# Patient Record
Sex: Male | Born: 1995 | Race: White | Hispanic: No | Marital: Single | Smoking: Never smoker
Health system: Southern US, Community
[De-identification: ages and names within clinical notes are randomized; demographics above are authoritative.]

## PROBLEM LIST (undated history)

## (undated) DIAGNOSIS — H5440 Blindness, one eye, unspecified eye: Secondary | ICD-10-CM

## (undated) DIAGNOSIS — F329 Major depressive disorder, single episode, unspecified: Secondary | ICD-10-CM

## (undated) DIAGNOSIS — F319 Bipolar disorder, unspecified: Secondary | ICD-10-CM

## (undated) DIAGNOSIS — F32A Depression, unspecified: Secondary | ICD-10-CM

## (undated) HISTORY — PX: EYE SURGERY: SHX253

---
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
## 2017-07-28 ENCOUNTER — Emergency Department: Admission: EM | Admit: 2017-07-28 | Discharge: 2017-07-28 | Discharge status: Against Medical Advice | Payer: Self-pay

## 2017-07-28 ENCOUNTER — Emergency Department
Admission: EM | Admit: 2017-07-28 | Discharge: 2017-07-29 | Disposition: A | Discharge status: Other Acute Inpatient Hospital | Payer: Self-pay | Attending: Student in an Organized Health Care Education/Training Program | Admitting: Student in an Organized Health Care Education/Training Program

## 2017-07-28 DIAGNOSIS — F1210 Cannabis abuse, uncomplicated: Secondary | ICD-10-CM

## 2017-07-28 DIAGNOSIS — F29 Unspecified psychosis not due to a substance or known physiological condition: Secondary | ICD-10-CM | POA: Insufficient documentation

## 2017-07-28 DIAGNOSIS — R443 Hallucinations, unspecified: Secondary | ICD-10-CM

## 2017-07-28 DIAGNOSIS — F2081 Schizophreniform disorder: Secondary | ICD-10-CM

## 2017-07-28 LAB — COMPREHENSIVE METABOLIC PANEL
ALK PHOS: 64 U/L (ref 38–126)
ALT: 18 U/L (ref 17–63)
ANION GAP: 11 (ref 5–15)
AST: 23 U/L (ref 15–41)
Albumin: 5.2 g/dL — ABNORMAL HIGH (ref 3.5–5.0)
BILIRUBIN TOTAL: 3.5 mg/dL — AB (ref 0.3–1.2)
BUN: 25 mg/dL — ABNORMAL HIGH (ref 6–20)
CALCIUM: 9.5 mg/dL (ref 8.9–10.3)
CO2: 27 mmol/L (ref 22–32)
CREATININE: 0.99 mg/dL (ref 0.61–1.24)
Chloride: 101 mmol/L (ref 101–111)
Glucose, Bld: 112 mg/dL — ABNORMAL HIGH (ref 65–99)
Potassium: 4 mmol/L (ref 3.5–5.1)
SODIUM: 139 mmol/L (ref 135–145)
TOTAL PROTEIN: 8 g/dL (ref 6.5–8.1)

## 2017-07-28 LAB — CBC WITH DIFFERENTIAL/PLATELET
BASOS PCT: 1 %
Basophils Absolute: 0.1 10*3/uL (ref 0–0.1)
EOS ABS: 0.1 10*3/uL (ref 0–0.7)
EOS PCT: 1 %
HCT: 47.8 % (ref 40.0–52.0)
HEMOGLOBIN: 16.6 g/dL (ref 13.0–18.0)
LYMPHS ABS: 3.1 10*3/uL (ref 1.0–3.6)
Lymphocytes Relative: 32 %
MCH: 30.8 pg (ref 26.0–34.0)
MCHC: 34.8 g/dL (ref 32.0–36.0)
MCV: 88.6 fL (ref 80.0–100.0)
Monocytes Absolute: 0.8 10*3/uL (ref 0.2–1.0)
Monocytes Relative: 8 %
NEUTROS PCT: 58 %
Neutro Abs: 5.6 10*3/uL (ref 1.4–6.5)
PLATELETS: 269 10*3/uL (ref 150–440)
RBC: 5.39 MIL/uL (ref 4.40–5.90)
RDW: 12 % (ref 11.5–14.5)
WBC: 9.6 10*3/uL (ref 3.8–10.6)

## 2017-07-28 LAB — SALICYLATE LEVEL

## 2017-07-28 LAB — ACETAMINOPHEN LEVEL

## 2017-07-28 LAB — ETHANOL: Alcohol, Ethyl (B): <10 mg/dL — ABNORMAL HIGH (ref <5)

## 2017-07-28 MED ORDER — LORAZEPAM 2 MG PO TABS: 2.0000 mg | ORAL_TABLET | ORAL | Status: DC | PRN

## 2017-07-28 MED ORDER — OLANZAPINE 10 MG PO TBDP
10.0000 mg | ORAL_TABLET | Freq: Every day | ORAL | Status: DC
  Administered 2017-07-28: 10 mg via ORAL
  Filled 2017-07-28: qty 2
  Filled 2017-07-28: qty 1

## 2017-07-28 NOTE — Consult Note (Signed)
[place] Psychiatry Consult   Reason for Consult:  Consult for 21 year old man with no past psychiatric history referred from [facility] for psychosis Referring Physician:  Quentin Cornwall Patient Identification: Darryl Hughes MRN:  [id number] Principal Diagnosis: Schizophreniform disorder [facility]) Diagnosis:   Patient Active Problem List   Diagnosis Date Noted  . Schizophreniform disorder ([place]) [F20.81] 07/28/2017  . 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 male patient admitted with "he is with me all the time".  HPI:  Patient interviewed. I also spoke to both his mother and father on the telephone. This is a 34 year old man who was sent here under papers filed at [facility]. The paperwork at [facility] reports that he is acutely psychotic and has been using LSD. On interview the patient presents as a neatly groomed and dressed young man who is speaking in almost complete word salad. He seems to be making at least some attempt to engage in conversation but almost nothing that he says ultimately make sense and he was not able to answer any but a few of the simplest questions. He makes odd statements like saying that "he is with me all the time". When I ask him who he means by this, for instance God, he giggles and makes strange man waving motions with his hands. Ride several times to engage him in at least some basic information and was not able to do so. Nevertheless he was not aggressive or threatening or hostile at all and his affect seems fairly pleasant if a little bit confused. His mother reports that all of this just started about a month ago. Up until that time she says he was functioning okay. She does know that he has been using LSD recently. She is not sure how often he is doing that but thinks that most recent time was about 4 days ago. This weekend apparently he became completely on more. He disappeared for a day or more  at a time and no one knew where he was and then he popped up back at home.  Medical history: Patient has no known significant medical problems  Social history: Patient lives with his mother. Parents are divorced. Has some relationship with his father as well. Patient was attending [employer]. Not clear how recently he was still in school.  Substance abuse history: Apparently he has been using LSD recently. No idea how long that's been going on. Patient wasn't able to reasonably answer any questions about it so I don't know if he's using any other drugs. Drug screen is still pending.  Past Psychiatric History: No past psychiatric history at all. Never seen a mental health professional for anything in the past. Never prescribed any psychiatric medicine. Seeing the doctor at [facility] today was his first encounter with mental health.  Risk to Self: Is patient at risk for suicide?: No, but patient needs Medical Clearance Risk to Others:   Prior Inpatient Therapy:   Prior Outpatient Therapy:    Past Medical History: History reviewed. No pertinent past medical history. History reviewed. No pertinent surgical history. Family History: No family history on file. Family Psychiatric  History: Mother reports that there is one great aunt who has schizophrenia and that several members of the family herself included have had significant depression. Social History:  History  Alcohol use Not on file     History  Drug use: Unknown    Social  History   Social History  . Marital status: Single    Spouse name: N/A  . Number of children: N/A  . Years of education: N/A   Social History Main Topics  . Smoking status: None  . Smokeless tobacco: None  . Alcohol use None  . Drug use: Unknown  . Sexual activity: Not Asked   Other Topics Concern  . None   Social History Narrative  . None   Additional Social History:    Allergies:  No Known Allergies  Labs:  Results for orders placed or performed  during the hospital encounter of 07/28/17 (from the past 48 hour(s))  Comprehensive metabolic panel     Status: Abnormal   Collection Time: 07/28/17  7:24 PM  Result Value Ref Range   Sodium 139 135 - 145 mmol/L   Potassium 4.0 3.5 - 5.1 mmol/L   Chloride 101 101 - 111 mmol/L   CO2 27 22 - 32 mmol/L   Glucose, Bld 112 (H) 65 - 99 mg/dL   BUN 25 (H) 6 - 20 mg/dL   Creatinine, Ser 0.99 0.61 - 1.24 mg/dL   Calcium 9.5 8.9 - 10.3 mg/dL   Total Protein 8.0 6.5 - 8.1 g/dL   Albumin 5.2 (H) 3.5 - 5.0 g/dL   AST 23 15 - 41 U/L   ALT 18 17 - 63 U/L   Alkaline Phosphatase 64 38 - 126 U/L   Total Bilirubin 3.5 (H) 0.3 - 1.2 mg/dL   GFR calc non Af Amer >60 >60 mL/min   GFR calc Af Amer >60 >60 mL/min    Comment: (NOTE) The eGFR has been calculated using the CKD EPI equation. This calculation has not been validated in all clinical situations. eGFR's persistently <60 mL/min signify possible Chronic Kidney Disease.    Anion gap 11 5 - 15  Ethanol     Status: Abnormal   Collection Time: 07/28/17  7:24 PM  Result Value Ref Range   Alcohol, Ethyl (B) <10 (H) <5 mg/dL    Comment:        LOWEST DETECTABLE LIMIT FOR SERUM ALCOHOL IS 10 mg/dL FOR MEDICAL PURPOSES ONLY   CBC with Diff     Status: None   Collection Time: 07/28/17  7:24 PM  Result Value Ref Range   WBC 9.6 3.8 - 10.6 K/uL   RBC 5.39 4.40 - 5.90 MIL/uL   Hemoglobin 16.6 13.0 - 18.0 g/dL   HCT 47.8 40.0 - 52.0 %   MCV 88.6 80.0 - 100.0 fL   MCH 30.8 26.0 - 34.0 pg   MCHC 34.8 32.0 - 36.0 g/dL   RDW 12.0 11.5 - 14.5 %   Platelets 269 150 - 440 K/uL   Neutrophils Relative % 58 %   Neutro Abs 5.6 1.4 - 6.5 K/uL   Lymphocytes Relative 32 %   Lymphs Abs 3.1 1.0 - 3.6 K/uL   Monocytes Relative 8 %   Monocytes Absolute 0.8 0.2 - 1.0 K/uL   Eosinophils Relative 1 %   Eosinophils Absolute 0.1 0 - 0.7 K/uL   Basophils Relative 1 %   Basophils Absolute 0.1 0 - 0.1 K/uL  Acetaminophen level     Status: Abnormal   Collection  Time: 07/28/17  7:24 PM  Result Value Ref Range   Acetaminophen (Tylenol), Serum <10 (L) 10 - 30 ug/mL    Comment:        THERAPEUTIC CONCENTRATIONS VARY SIGNIFICANTLY. A RANGE OF 10-30 ug/mL MAY BE AN EFFECTIVE  CONCENTRATION FOR MANY PATIENTS. HOWEVER, SOME ARE BEST TREATED AT CONCENTRATIONS OUTSIDE THIS RANGE. ACETAMINOPHEN CONCENTRATIONS >150 ug/mL AT 4 HOURS AFTER INGESTION AND >50 ug/mL AT 12 HOURS AFTER INGESTION ARE OFTEN ASSOCIATED WITH TOXIC REACTIONS.   Salicylate level     Status: None   Collection Time: 07/28/17  7:24 PM  Result Value Ref Range   Salicylate Lvl <6.2 2.8 - 30.0 mg/dL    No current facility-administered medications for this encounter.    No current outpatient prescriptions on file.    Musculoskeletal: Strength & Muscle Tone: within normal limits Gait & Station: normal Patient leans: N/A  Psychiatric Specialty Exam: Physical Exam  Nursing note and vitals reviewed. Constitutional: He appears well-developed and well-nourished.  HENT:  Head: Normocephalic and atraumatic.  Eyes: Pupils are equal, round, and reactive to light. Conjunctivae are normal.  Neck: Normal range of motion.  Cardiovascular: Regular rhythm and normal heart sounds.   Respiratory: Effort normal. No respiratory distress.  GI: Soft.  Musculoskeletal: Normal range of motion.  Neurological: He is alert.  Skin: Skin is warm and dry.  Psychiatric: His affect is inappropriate. His speech is delayed and tangential. He is slowed, withdrawn and actively hallucinating. Thought content is delusional. Cognition and memory are impaired. He expresses inappropriate judgment. He expresses no homicidal and no suicidal ideation. He is noncommunicative. He is inattentive.    Review of Systems  Constitutional: Negative.   HENT: Negative.   Eyes: Negative.   Respiratory: Negative.   Cardiovascular: Negative.   Gastrointestinal: Negative.   Musculoskeletal: Negative.   Skin: Negative.    Neurological: Negative.   Psychiatric/Behavioral: Positive for substance abuse.    Pulse 96, temperature 98.9 F (37.2 C), temperature source Oral, resp. rate 18, height 5' 9"  (1.753 m), weight 65.3 kg (144 lb), SpO2 100 %.Body mass index is 21.27 kg/m.  General Appearance: Fairly Groomed  Eye Contact:  Fair  Speech:  Blocked and Slow  Volume:  Decreased  Mood:  Euthymic  Affect:  Inappropriate and Labile  Thought Process:  Disorganized  Orientation:  Negative  Thought Content:  Illogical and Tangential  Suicidal Thoughts:  No  Homicidal Thoughts:  No  Memory:  Negative  Judgement:  Negative  Insight:  Negative  Psychomotor Activity:  Restlessness  Concentration:  Concentration: Poor  Recall:  Poor  Fund of Knowledge:  Poor  Language:  Good  Akathisia:  No  Handed:  Right  AIMS (if indicated):     Assets:  Catering manager Housing Physical Health Resilience Social Support  ADL's:  Intact  Cognition:  Impaired,  Mild  Sleep:        Treatment Plan Summary: Daily contact with patient to assess and evaluate symptoms and progress in treatment, Medication management and Plan 21 year old man who presents as acutely psychotic. Very disorganized in his thinking. No known prior psychiatric history. Differential diagnosis includes schizophreniform disorder, bipolar disorder with psychosis and substance induced as well as rarer reasons to have acute psychosis. Patient clearly needs hospital treatment. Unable to think or make any decisions for himself. Continue the involuntary commitment. Orders will be placed to admit him to the hospital. We will get a full set of labs and EKG and start him on antipsychotic medicine. I have informed his mother that he will probably be admitted to the psychiatric ward here. Mother's name is Eliav Mechling and her telephone number is [phone]  Disposition: Recommend psychiatric Inpatient admission when medically cleared. Supportive  therapy provided about ongoing stressors.  Jenny Reichmann  Hanne Kegg, MD 07/28/2017 9:37 PM

## 2017-07-28 NOTE — ED Triage Notes (Signed)
Patient is brought to the ED from [facility] with IVC papers because he is hallucinating and hearing voices. Patient states he wants to hurt himself but doesn't want to hurt himself. Patient with pressured speech and inability to answer certain questions. Patient without injury at this time.

## 2017-07-28 NOTE — ED Notes (Signed)
PT IVC/ PENDING PLACEMENT  

## 2017-07-28 NOTE — ED Provider Notes (Signed)
[facility] Emergency Department Provider Note    First MD Initiated Contact with Patient 07/28/17 1946     (approximate)  I have reviewed the triage vital signs and the nursing notes.   HISTORY  Chief Complaint Hallucinations    HPI Darryl Hughes is a 21 y.o. male presents from [facility] with IVC papers because of hallucinations actively hearing voices. Patient with very limited helpful history as he does appear to be floridly psychotic with very disorganized thought process. He is talking about hearing voices that tell him to "calm down and see them because we are all connected to the outer space." there is report of LSD use. He denies any SI or HI.   History reviewed. No pertinent past medical history. No family history on file. History reviewed. No pertinent surgical history. Patient Active Problem List   Diagnosis Date Noted  . Schizophreniform disorder ([facility]) 07/28/2017  . Hallucinogen abuse 07/28/2017      Prior to Admission medications   Not on File    Allergies Patient has no known allergies.    Social History Social History  Substance Use Topics  . Smoking status: Not on file  . Smokeless tobacco: Not on file  . Alcohol use Not on file    Review of Systems Patient denies headaches, rhinorrhea, blurry vision, numbness, shortness of breath, chest pain, edema, cough, abdominal pain, nausea, vomiting, diarrhea, dysuria, fevers, rashes or hallucinations unless otherwise stated above in HPI. ____________________________________________   PHYSICAL EXAM:  VITAL SIGNS: Vitals:   07/28/17 1932  Pulse: 96  Resp: 18  Temp: 98.9 F (37.2 C)  SpO2: 100%    Constitutional: Alert disheveled appearing and in no acute distress. Eyes: Conjunctivae are normal.  Head: Atraumatic. Nose: No congestion/rhinnorhea. Mouth/Throat: Mucous membranes are moist.   Neck: No stridor. Painless ROM.  Cardiovascular: Normal rate, regular rhythm.  Grossly normal heart sounds.  Good peripheral circulation. Respiratory: Normal respiratory effort.  No retractions. Lungs CTAB. Gastrointestinal: Soft and nontender. No distention. No abdominal bruits. No CVA tenderness. Musculoskeletal: No lower extremity tenderness nor edema.  No joint effusions. Neurologic:  Normal speech and language. No gross focal neurologic deficits are appreciated. No facial droop Skin:  Skin is warm, dry and intact. No rash noted. Psychiatric: pleasant but very disorganized thought process and difficult to follow ____________________________________________   LABS (all labs ordered are listed, but only abnormal results are displayed)  Results for orders placed or performed during the hospital encounter of 07/28/17 (from the past 24 hour(s))  Comprehensive metabolic panel     Status: Abnormal   Collection Time: 07/28/17  7:24 PM  Result Value Ref Range   Sodium 139 135 - 145 mmol/L   Potassium 4.0 3.5 - 5.1 mmol/L   Chloride 101 101 - 111 mmol/L   CO2 27 22 - 32 mmol/L   Glucose, Bld 112 (H) 65 - 99 mg/dL   BUN 25 (H) 6 - 20 mg/dL   Creatinine, Ser 1.61 0.61 - 1.24 mg/dL   Calcium 9.5 8.9 - 09.6 mg/dL   Total Protein 8.0 6.5 - 8.1 g/dL   Albumin 5.2 (H) 3.5 - 5.0 g/dL   AST 23 15 - 41 U/L   ALT 18 17 - 63 U/L   Alkaline Phosphatase 64 38 - 126 U/L   Total Bilirubin 3.5 (H) 0.3 - 1.2 mg/dL   GFR calc non Af Amer >60 >60 mL/min   GFR calc Af Amer >60 >60 mL/min   Anion  gap 11 5 - 15  Ethanol     Status: Abnormal   Collection Time: 07/28/17  7:24 PM  Result Value Ref Range   Alcohol, Ethyl (B) <10 (H) <5 mg/dL  CBC with Diff     Status: None   Collection Time: 07/28/17  7:24 PM  Result Value Ref Range   WBC 9.6 3.8 - 10.6 K/uL   RBC 5.39 4.40 - 5.90 MIL/uL   Hemoglobin 16.6 13.0 - 18.0 g/dL   HCT 16.1 09.6 - 04.5 %   MCV 88.6 80.0 - 100.0 fL   MCH 30.8 26.0 - 34.0 pg   MCHC 34.8 32.0 - 36.0 g/dL   RDW 40.9 81.1 - 91.4 %   Platelets 269 150 - 440  K/uL   Neutrophils Relative % 58 %   Neutro Abs 5.6 1.4 - 6.5 K/uL   Lymphocytes Relative 32 %   Lymphs Abs 3.1 1.0 - 3.6 K/uL   Monocytes Relative 8 %   Monocytes Absolute 0.8 0.2 - 1.0 K/uL   Eosinophils Relative 1 %   Eosinophils Absolute 0.1 0 - 0.7 K/uL   Basophils Relative 1 %   Basophils Absolute 0.1 0 - 0.1 K/uL  Acetaminophen level     Status: Abnormal   Collection Time: 07/28/17  7:24 PM  Result Value Ref Range   Acetaminophen (Tylenol), Serum <10 (L) 10 - 30 ug/mL  Salicylate level     Status: None   Collection Time: 07/28/17  7:24 PM  Result Value Ref Range   Salicylate Lvl <7.0 2.8 - 30.0 mg/dL   ____________________________________________ ____________________________________________   PROCEDURES  Procedure(s) performed:  Procedures    Critical Care performed: no ____________________________________________   INITIAL IMPRESSION / ASSESSMENT AND PLAN / ED COURSE  Pertinent labs & imaging results that were available during my care of the patient were reviewed by me and considered in my medical decision making (see chart for details).  DDX: Psychosis, delirium, medication effect, noncompliance, polysubstance abuse, Si, Hi, depression   Darryl Hughes is a 21 y.o. who presents to the ED with for evaluation of hallucinations.  Patient has psych history of substance abuse.  Laboratory testing was ordered to evaluation for underlying electrolyte derangement or signs of underlying organic pathology to explain today's presentation.  Based on history and physical and laboratory evaluation, it appears that the patient's presentation is 2/2 underlying psychiatric disorder and will require further evaluation and management by inpatient psychiatry.  Patient was  made an IVC due to disorganized thought process.  Disposition pending psychiatric evaluation.       ____________________________________________   FINAL CLINICAL IMPRESSION(S) / ED DIAGNOSES  Final  diagnoses:  Hallucination  Psychosis, unspecified psychosis type      NEW MEDICATIONS STARTED DURING THIS VISIT:  New Prescriptions   No medications on file     Note:  This document was prepared using Dragon voice recognition software and may include unintentional dictation errors.    Willy Eddy, MD 07/28/17 2146

## 2017-07-28 NOTE — ED Notes (Signed)
Pt called to triage without answer.  

## 2017-07-28 NOTE — BH Assessment (Signed)
Assessment Note  Darryl Hughes is an 21 y.o. male. Pt presents with thought blocking behaviors. This writer was unable to appropriately assess this patient due to his level of acuity. Per triage note, patient is brought to the ED from [facility] with IVC papers because he is hallucinating and hearing voices. Patient states he wants to hurt himself but doesn't want to hurt himself. Patient with pressured speech and inability to answer certain questions.  Diagnosis: Unspecified Schizophrenia Spectrum and Other Psychotic Disorder  Past Medical History: History reviewed. No pertinent past medical history.  History reviewed. No pertinent surgical history.  Family History: No family history on file.  Social History:  has no tobacco, alcohol, and drug history on file.  Additional Social History:  Alcohol / Drug Use Pain Medications: None Reported Prescriptions: None Reported Over the Counter: None Reported History of alcohol / drug use?: No history of alcohol / drug abuse  CIWA: CIWA-Ar Pulse Rate: 96 COWS:    Allergies: No Known Allergies  Home Medications:  (Not in a hospital admission)  OB/GYN Status:  No LMP for male patient.  General Assessment Data Location of Assessment: [facility] ED TTS Assessment: In system Is this a Tele or Face-to-Face Assessment?: Face-to-Face Is this an Initial Assessment or a Re-assessment for this encounter?: Initial Assessment Marital status: Single Maiden name:  (N/A) Is patient pregnant?: No Pregnancy Status: No Living Arrangements:  Otho Bellows) Can pt return to current living arrangement?: Yes Admission Status: Involuntary Is patient capable of signing voluntary admission?: No Referral Source: Self/Family/Friend Insurance type: None  Medical Screening Exam [facility] Walk-in ONLY) Medical Exam completed: Yes  Crisis Care Plan Living Arrangements:  Otho Bellows) Legal Guardian:  (UTA) Name of Psychiatrist: None Name of Therapist: None  Education Status Is  patient currently in school?: No Current Grade: UTA Highest grade of school patient has completed: UTA Name of school: UTA Contact person: UTA  Risk to self with the past 6 months Suicidal Ideation:  (UTA) Has patient been a risk to self within the past 6 months prior to admission? :  (UTA) Suicidal Intent:  (UTA) Has patient had any suicidal intent within the past 6 months prior to admission? :  (UTA) Is patient at risk for suicide?:  (UTA) Suicidal Plan?:  (UTA) Has patient had any suicidal plan within the past 6 months prior to admission? :  (UTA) Access to Means:  (UTA) What has been your use of drugs/alcohol within the last 12 months?:  (UTA) Previous Attempts/Gestures:  (UTA) How many times?:  (UTA) Other Self Harm Risks:  (UTA) Triggers for Past Attempts:  (UTA) Intentional Self Injurious Behavior:  (UTA) Family Suicide History: Unable to assess Recent stressful life event(s):  (UTA) Persecutory voices/beliefs?:  Rich Reining) Depression:  (UTA) Depression Symptoms:  (UTA) Substance abuse history and/or treatment for substance abuse?:  (UTA) Suicide prevention information given to non-admitted patients:  (UTA)  Risk to Others within the past 6 months Homicidal Ideation:  (UTA) Does patient have any lifetime risk of violence toward others beyond the six months prior to admission? :  (UTA) Thoughts of Harm to Others:  (UTA) Current Homicidal Intent:  (UTA) Current Homicidal Plan:  (UTA) Access to Homicidal Means:  (UTA) Identified Victim:  (UTA) History of harm to others?:  (UTA) Assessment of Violence:  (UTA) Violent Behavior Description: UTA Does patient have access to weapons?:  (UTA) Criminal Charges Pending?:  (UTA) Does patient have a court date:  (UTA) Is patient on probation?: Unknown  Psychosis Hallucinations:  (  UTA) Delusions:  (UTA)  Mental Status Report Appearance/Hygiene: Poor hygiene, Body odor, In scrubs Eye Contact: Poor Motor Activity: Freedom of  movement Speech: Incoherent, Word salad Level of Consciousness: Unable to assess Mood:  (UTA) Affect: Blunted (Thought blocking behaviors) Anxiety Level: Minimal Thought Processes: Thought Blocking Judgement: Unable to Assess Orientation: Unable to assess Obsessive Compulsive Thoughts/Behaviors: Unable to Assess  Cognitive Functioning Concentration: Poor Memory: Unable to Assess IQ: Average Insight: Unable to Assess Impulse Control: Unable to Assess Appetite: Good Weight Loss: 0 Weight Gain: 0 Sleep: Unable to Assess Total Hours of Sleep: 0 Vegetative Symptoms: Unable to Assess  ADLScreening [facility] Assessment Services) Patient's cognitive ability adequate to safely complete daily activities?: Yes Patient able to express need for assistance with ADLs?: Yes Independently performs ADLs?: Yes (appropriate for developmental age)  Prior Inpatient Therapy Prior Inpatient Therapy: No  Prior Outpatient Therapy Prior Outpatient Therapy: No Does patient have an ACCT team?: No Does patient have Intensive In-House Services?  : No Does patient have [facility] services? : No Does patient have P4CC services?: No  ADL Screening (condition at time of admission) Patient's cognitive ability adequate to safely complete daily activities?: Yes Patient able to express need for assistance with ADLs?: Yes Independently performs ADLs?: Yes (appropriate for developmental age)       Abuse/Neglect Assessment (Assessment to be complete while patient is alone) Physical Abuse: Denies Verbal Abuse: Denies Sexual Abuse: Denies Exploitation of patient/patient's resources: Denies Self-Neglect: Denies Values / Beliefs Cultural Requests During Hospitalization: None Spiritual Requests During Hospitalization: None Consults Spiritual Care Consult Needed: No Social Work Consult Needed: No      Additional Information 1:1 In Past 12 Months?: No CIRT Risk: No Elopement Risk: No Does patient have  medical clearance?: Yes  Child/Adolescent Assessment Running Away Risk: Denies (Patient is an adult)  Disposition:  Disposition Initial Assessment Completed for this Encounter: Yes Disposition of Patient: Referred to (Admit to inpatient) Patient referred to: Other (Comment) (Admit to inpatient)  On Site Evaluation by:   Reviewed with Physician:    Wilmon Arms 07/28/2017 10:34 PM

## 2017-07-29 ENCOUNTER — Inpatient Hospital Stay
Admission: AD | Admit: 2017-07-29 | Discharge: 2017-08-06 | DRG: 885 | Disposition: A | Discharge status: Home / Self Care | Payer: No Typology Code available for payment source | Attending: Psychiatry | Admitting: Psychiatry

## 2017-07-29 DIAGNOSIS — G4700 Insomnia, unspecified: Secondary | ICD-10-CM | POA: Diagnosis present

## 2017-07-29 DIAGNOSIS — F3164 Bipolar disorder, current episode mixed, severe, with psychotic features: Secondary | ICD-10-CM | POA: Diagnosis present

## 2017-07-29 DIAGNOSIS — F1210 Cannabis abuse, uncomplicated: Secondary | ICD-10-CM | POA: Diagnosis present

## 2017-07-29 DIAGNOSIS — F1690 Hallucinogen use, unspecified, uncomplicated: Secondary | ICD-10-CM | POA: Diagnosis present

## 2017-07-29 MED ORDER — ALUM & MAG HYDROXIDE-SIMETH 200-200-20 MG/5ML PO SUSP
30.0000 mL | ORAL | Status: DC | PRN
  Filled 2017-07-29: qty 30

## 2017-07-29 MED ORDER — OLANZAPINE 5 MG PO TBDP
10.0000 mg | ORAL_TABLET | Freq: Every day | ORAL | Status: DC
  Administered 2017-07-29: 10 mg via ORAL
  Filled 2017-07-29: qty 2

## 2017-07-29 MED ORDER — LORAZEPAM 2 MG PO TABS
2.0000 mg | ORAL_TABLET | ORAL | Status: DC | PRN
  Administered 2017-07-31 – 2017-08-04 (×4): 2 mg via ORAL
  Filled 2017-07-29 (×4): qty 1

## 2017-07-29 MED ORDER — MAGNESIUM HYDROXIDE 400 MG/5ML PO SUSP: 30.0000 mL | Freq: Every day | ORAL | Status: DC | PRN

## 2017-07-29 MED ORDER — ACETAMINOPHEN 325 MG PO TABS: 650.0000 mg | ORAL_TABLET | Freq: Four times a day (QID) | ORAL | Status: DC | PRN

## 2017-07-29 NOTE — Progress Notes (Signed)
Admission Note: Amy RN BMU 21 year old white ma  In under the services of  Dr Jennet Maduro .  Present with  scrubs  Totally confused  Cannot keep a train of thought . Pacing halls on unit. Patient  able to recognize that it is his birthday . Patient hd been using  LSD. Inappropriate Laugther  Pt appeared depressed  With  a flat affect.  Pt  Denies suicidal ideation Pt is redirectable and cooperative with assessment.      A: Pt admitted to unit per protocol, skin assessment and search done with Gigi RN  and no contraband found.  Pt  educated on therapeutic milieu rules. Pt was introduced to milieu by nursing staff.    R: Pt was receptive to education about the milieu .  15 min safety checks started. Clinical research associate offered support

## 2017-07-29 NOTE — ED Notes (Signed)
Patient calm and cooperative.  Mood is elated.  Patient laughs inappropriately when questioned about AVH; however he denies any hallucinations.

## 2017-07-29 NOTE — Tx Team (Signed)
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  The patient and family have been given the opportunity to ask questions and make suggestions.  Crist Infante, RN 07/29/2017, 6:25 PM

## 2017-07-29 NOTE — ED Notes (Signed)
Pt continues to be disoriented. Security and RN have re-directed patient twice after attempting to enter other patient rooms.  Pt returned to assigned room without incident. Maintained on 15 minute checks and observation by security camera for safety.

## 2017-07-29 NOTE — ED Notes (Addendum)
Pt's mother came to visit.  Pt is disoriented and confused, but was happy to see his mother.     Pt currently taking a shower.  Maintained on 15 minute checks and observation by security camera for safety.

## 2017-07-29 NOTE — ED Notes (Signed)
Report to [facility], move to [facility] room 5

## 2017-07-29 NOTE — ED Notes (Signed)
Pt's mother called earlier this morning. Pt unable to speak on the phone, only opening his eyes. No verbal response. Pt is now awake eating lunch. Pt asked this Clinical research associate when he would be able to leave the hospital.  RN explained  that decision would be made by the doctors. When asked if he remembered speaking to the psychiatrist pt stated, "Yes, he did a fine job."     Maintained on 15 minute checks and observation by security camera for safety.

## 2017-07-29 NOTE — ED Notes (Signed)
Patient resting quietly in room. No noted distress or abnormal behaviors noted. Will continue 15 minute checks and observation by security camera for safety. 

## 2017-07-29 NOTE — Plan of Care (Signed)
Problem: Education: Goal: Knowledge of [facility] Education information/materials will improve Outcome: Not Applicable Date Met: 46/19/01 New admission  Unable to understand  Problem: Coping: Goal: Ability to cope will improve Outcome: Not Progressing New admission  Unable to understand Goal: Ability to verbalize feelings will improve Outcome: Not Progressing New admission  Unable to understand  Problem: Safety: Goal: Ability to disclose and discuss suicidal ideas will improve Outcome: Not Progressing New admission  Unable to understand Goal: Ability to identify and utilize support systems that promote safety will improve Outcome: Not Progressing New admission  Unable to understand  Problem: Education: Goal: Knowledge of disease or condition will improve Outcome: Not Progressing New admission  Unable to understand Goal: Understanding of discharge needs will improve Outcome: Not Progressing New admission  Unable to understand

## 2017-07-30 ENCOUNTER — Encounter: Payer: Self-pay | Admitting: Psychiatry

## 2017-07-30 LAB — LIPID PANEL
CHOLESTEROL: 135 mg/dL (ref 0–200)
HDL: 32 mg/dL — ABNORMAL LOW (ref >40)
LDL CALC: 84 mg/dL (ref 0–99)
Total CHOL/HDL Ratio: 4.2 RATIO
Triglycerides: 95 mg/dL (ref <150)
VLDL: 19 mg/dL (ref 0–40)

## 2017-07-30 LAB — HEMOGLOBIN A1C
HEMOGLOBIN A1C: 4.9 % (ref 4.8–5.6)
Mean Plasma Glucose: 93.93 mg/dL

## 2017-07-30 LAB — TSH: TSH: 3.229 u[IU]/mL (ref 0.350–4.500)

## 2017-07-30 MED ORDER — LITHIUM CARBONATE ER 300 MG PO TBCR
300.0000 mg | EXTENDED_RELEASE_TABLET | Freq: Three times a day (TID) | ORAL | Status: DC
  Administered 2017-07-30 – 2017-08-03 (×12): 300 mg via ORAL
  Filled 2017-07-30 (×12): qty 1

## 2017-07-30 MED ORDER — TEMAZEPAM 15 MG PO CAPS
15.0000 mg | ORAL_CAPSULE | Freq: Every evening | ORAL | Status: DC | PRN
  Administered 2017-08-03 – 2017-08-04 (×2): 15 mg via ORAL
  Filled 2017-07-30 (×2): qty 1

## 2017-07-30 MED ORDER — OLANZAPINE 5 MG PO TBDP
10.0000 mg | ORAL_TABLET | Freq: Two times a day (BID) | ORAL | Status: DC
  Administered 2017-07-30 – 2017-08-04 (×10): 10 mg via ORAL
  Filled 2017-07-30 (×9): qty 2

## 2017-07-30 MED ORDER — DIVALPROEX SODIUM 500 MG PO DR TAB: 500.0000 mg | DELAYED_RELEASE_TABLET | Freq: Three times a day (TID) | ORAL | Status: DC

## 2017-07-30 NOTE — [facility] Counselor (Signed)
Adult Comprehensive Assessment  Patient ID: Darryl Hughes, male   DOB: Nov 08, 1995, 21 y.o.   MRN: [id number]  Information Source: Information source: Patient  Current Stressors:  Educational / Learning stressors:  (Pt does not report stressors other than "I'm concerned about all of the bad things in the past.")  Living/Environment/Situation:  Living Arrangements: Parent (mother) Living conditions (as described by patient or guardian): positive situation How long has patient lived in current situation?: pt unable to specify What is atmosphere in current home: Supportive  Family History:  Marital status: Single Are you sexually active?: Yes What is your sexual orientation?: heterosexual Has your sexual activity been affected by drugs, alcohol, medication, or emotional stress?: no Does patient have children?: No  Childhood History:  By whom was/is the patient raised?: Both parents Additional childhood history information: Parents divorced when pt was young. (unable to give specific age) "I only remember the good things about my childhood." Description of patient's relationship with caregiver when they were a child: Good relationships with both parents. Patient's description of current relationship with people who raised him/her: Good relationship with both parents.  Father not in the home, pt reports he sees his father frequently. How were you disciplined when you got in trouble as a child/adolescent?: appropriate discipline Does patient have siblings?: Yes Number of Siblings: 5 Description of patient's current relationship with siblings: 2 brothers, 3 sisters.  Pt reports he gets along with his siblings "when I see them."  All live in state of [place]. Did patient suffer any verbal/emotional/physical/sexual abuse as a child?: No Did patient suffer from severe childhood neglect?: No Has patient ever been sexually abused/assaulted/raped as an adolescent or adult?: No Was the patient ever a  victim of a crime or a disaster?: No Witnessed domestic violence?: No Has patient been effected by domestic violence as an adult?: No  Education:  Highest grade of school patient has completed: HS graduate, less than one year of college at [employer]. Currently a student?: No Learning disability?:  (unknown)  Employment/Work Situation:   Employment situation: Unemployed Patient's job has been impacted by current illness: Yes Describe how patient's job has been impacted: unable to hold job What is the longest time patient has a held a job?: 6 months? Where was the patient employed at that time?: [employer] Has patient ever been in the [employer]?: No Are There Guns or Other Weapons in Your Home?: No  Financial Resources:   Financial resources: No income Does patient have a Lawyer or guardian?: No  Alcohol/Substance Abuse:   What has been your use of drugs/alcohol within the last 12 months?: alcohol: "when I'm with my friends"  "I drink a lot."  Drugs: Marijuana, Acid.  MDMA.  Pt unable to give specifics of use patterns. If attempted suicide, did drugs/alcohol play a role in this?: No Alcohol/Substance Abuse Treatment Hx: Denies past history Has alcohol/substance abuse ever caused legal problems?: No  Social Support System:   Patient's Community Support System: Good Describe Community Support System: mother, father, siblings Type of faith/religion: I used to go to church How does patient's faith help to cope with current illness?: Pt unable to answer  Leisure/Recreation:   Leisure and Hobbies: sports: tennis, basketball.  Music: vocals.  Strengths/Needs:   What things does the patient do well?: Pt unable to answer In what areas does patient struggle / problems for patient: PT unable to answer  Discharge Plan:   Does patient have access to transportation?: Yes (mother) Will patient  be returning to same living situation after discharge?: Yes (with mother) Currently receiving  community mental health services: No If no, would patient like referral for services when discharged?: Yes (What [place] Does patient have financial barriers related to discharge medications?: Yes Patient description of barriers related to discharge medications: no insurance  Summary/Recommendations:   Summary and Recommendations (to be completed by the evaluator): Pt is 21 year old male from [place]. Pt is diagnosed with Schizophreniform disorder and was admitted due to new onset psychosis.  Recommendations for pt include crisis stabilization, therapeutic milieu, attend and participate in groups, medication management, and development of comprhensive mental wellness plan.  Lorri Frederick. 07/30/2017

## 2017-07-30 NOTE — [facility] Group Notes (Signed)
[facility] LCSW Group Therapy Note  Date/Time: 07/30/17, 0930  Type of Therapy/Topic:  Group Therapy:  Balance in Life  Participation Level:  active  Description of Group:    This group will address the concept of balance and how it feels and looks when one is unbalanced. Patients will be encouraged to process areas in their lives that are out of balance, and identify reasons for remaining unbalanced. Facilitators will guide patients utilizing problem- solving interventions to address and correct the stressor making their life unbalanced. Understanding and applying boundaries will be explored and addressed for obtaining  and maintaining a balanced life. Patients will be encouraged to explore ways to assertively make their unbalanced needs known to significant others in their lives, using other group members and facilitator for support and feedback.  Therapeutic Goals: 1. Patient will identify two or more emotions or situations they have that consume much of in their lives. 2. Patient will identify signs/triggers that life has become out of balance:  3. Patient will identify two ways to set boundaries in order to achieve balance in their lives:  4. Patient will demonstrate ability to communicate their needs through discussion and/or role plays  Summary of Patient Progress:Pt identified mental/emotional and family as out of balance in his life.  Pt then said that his own mental health problems are stressing out his family.  Pt made a number of appropriate comments and contributions to the group discussion.          Therapeutic Modalities:   Cognitive Behavioral Therapy Solution-Focused Therapy Assertiveness Training  Daleen Squibb, [place]

## 2017-07-30 NOTE — H&P (Addendum)
Psychiatric Admission Assessment Adult  Patient Identification: Darryl Hughes MRN:  [id number] Date of Evaluation:  07/30/2017 Chief Complaint:  Depression Principal Diagnosis: Bipolar I disorder, most recent episode mixed, severe with psychotic features [facility]) Diagnosis:   Patient Active Problem List   Diagnosis Date Noted  . Bipolar I disorder, most recent episode mixed, severe with psychotic features (HCC) [F31.64] 07/29/2017  . 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 Darryl Hughes is a 21 year old male with first episode psychosis.  Chief complaint. "I was suicidal but not any longer."  History of present illness. Information was obtained from the patient and the chart. The patient was brought to the emergency room floridly psychotic and unable to provide any information. His family. The patient was using. He was admitted to psychiatry unit with a diagnosis of psychotic break. Today the patient is still disorganized but able to participate in the interview. She reports that for the past week or so he has been extremely depressed, lonely, and suicidal. He came to the hospital to "get away from his family". He is very adamant to tell me that he no longer feels depressed or suicidal. He is only depressed about the fact that he is in the hospital. He demands to be discharged as soon as possible. He quickly understood the rules of voluntary and involuntary admission and wants to sign 72 hour form. He reports that prior to depressive episode he felt extremely happy. He denies substance use understands that this is what his mother believes. He seems somewhat paranoid but denies auditory or visual hallucinations or religious preoccupations. He is hyper, argumentative, edgy, and very sharp. He denies any changes in his sleep and believes that he can sleep 8 hours if he chooses to. He denies changes in appetite. He denies any symptoms of anxiety.  Past  psychiatric history. Apparently there is no history of mental illness or substance abuse. The patient makes a comment that he is problems occurred very suddenly causing academic problems. He denies ever attempting suicide. There were no hospitalizations.  Family psychiatric history. The patient denies any knowledge of history of mental illness, substance abuse, or suicide in the family.  Social history. He lives with his parents. He does not work. He went to [employer] but dropped out. He tried [employer] but did not do well.   Total Time spent with patient: 1 hour  Is the patient at risk to self? Yes.    Has the patient been a risk to self in the past 6 months? No.  Has the patient been a risk to self within the distant past? No.  Is the patient a risk to others? No.  Has the patient been a risk to others in the past 6 months? No.  Has the patient been a risk to others within the distant past? No.   Prior Inpatient Therapy:   Prior Outpatient Therapy:    Alcohol Screening: 1. How often do you have a drink containing alcohol?: Monthly or less 2. How many drinks containing alcohol do you have on a typical day when you are drinking?: 3 or 4 3. How often do you have six or more drinks on one occasion?: Less than monthly Preliminary Score: 2 4. How often during the last year have you found that you were not able to stop drinking once you had started?: Less than monthly 5. How often during the last year have you failed to do what was normally expected  from you becasue of drinking?: Never 6. How often during the last year have you needed a first drink in the morning to get yourself going after a heavy drinking session?: Never 7. How often during the last year have you had a feeling of guilt of remorse after drinking?: Never 8. How often during the last year have you been unable to remember what happened the night before because you had been drinking?: Never 9. Have you or someone else been injured as a  result of your drinking?: No 10. Has a relative or friend or a doctor or another health worker been concerned about your drinking or suggested you cut down?: No Alcohol Use Disorder Identification Test Final Score (AUDIT): 4 Substance Abuse History in the last 12 months:  No. Consequences of Substance Abuse: NA Previous Psychotropic Medications: No  Psychological Evaluations: No  Past Medical History: History reviewed. No pertinent past medical history. History reviewed. No pertinent surgical history. Family History: History reviewed. No pertinent family history.  Tobacco Screening: Have you used any form of tobacco in the last 30 days? (Cigarettes, Smokeless Tobacco, Cigars, and/or Pipes): No Social History:  History  Alcohol use Not on file     History  Drug use: Unknown    Additional Social History: Marital status: Single Are you sexually active?: Yes What is your sexual orientation?: heterosexual Has your sexual activity been affected by drugs, alcohol, medication, or emotional stress?: no Does patient have children?: No                         Allergies:  Not on File Lab Results:  Results for orders placed or performed during the hospital encounter of 07/29/17 (from the past 48 hour(s))  Hemoglobin A1c     Status: None   Collection Time: 07/30/17  6:56 AM  Result Value Ref Range   Hgb A1c MFr Bld 4.9 4.8 - 5.6 %    Comment: (NOTE) Pre diabetes:          5.7%-6.4% Diabetes:              >6.4% Glycemic control for   <7.0% adults with diabetes    Mean Plasma Glucose 93.93 mg/dL    Comment: Performed at [facility], [address] [place], [place] [address]  Lipid panel     Status: Abnormal   Collection Time: 07/30/17  6:56 AM  Result Value Ref Range   Cholesterol 135 0 - 200 mg/dL   Triglycerides 95 <604 mg/dL   HDL 32 (L) >54 mg/dL   Total CHOL/HDL Ratio 4.2 RATIO   VLDL 19 0 - 40 mg/dL   LDL Cholesterol 84 0 - 99 mg/dL    Comment:        Total  Cholesterol/HDL:CHD Risk Coronary Heart Disease Risk Table                     Men   Women  1/2 Average Risk   3.4   3.3  Average Risk       5.0   4.4  2 X Average Risk   9.6   7.1  3 X Average Risk  23.4   11.0        Use the calculated Patient Ratio above and the CHD Risk Table to determine the patient's CHD Risk.        ATP III CLASSIFICATION (LDL):  <100     mg/dL   Optimal  100-129  mg/dL   Near or Above                    Optimal  130-159  mg/dL   Borderline  161-096  mg/dL   High  >045     mg/dL   Very High   TSH     Status: None   Collection Time: 07/30/17  6:56 AM  Result Value Ref Range   TSH 3.229 0.350 - 4.500 uIU/mL    Comment: Performed by a 3rd Generation assay with a functional sensitivity of <=0.01 uIU/mL.    Blood Alcohol level:  Lab Results  Component Value Date   ETH <10 (H) 07/28/2017    Metabolic Disorder Labs:  Lab Results  Component Value Date   HGBA1C 4.9 07/30/2017   MPG 93.93 07/30/2017   No results found for: PROLACTIN Lab Results  Component Value Date   CHOL 135 07/30/2017   TRIG 95 07/30/2017   HDL 32 (L) 07/30/2017   CHOLHDL 4.2 07/30/2017   VLDL 19 07/30/2017   LDLCALC 84 07/30/2017    Current Medications: Current Facility-Administered Medications  Medication Dose Route Frequency Provider Last Rate Last Dose  . acetaminophen (TYLENOL) tablet 650 mg  650 mg Oral Q6H PRN Clapacs, John T, MD      . alum & mag hydroxide-simeth (MAALOX/MYLANTA) 200-200-20 MG/5ML suspension 30 mL  30 mL Oral Q4H PRN Clapacs, John T, MD      . lithium carbonate (LITHOBID) CR tablet 300 mg  300 mg Oral TID AC Katria Botts B, MD      . LORazepam (ATIVAN) tablet 2 mg  2 mg Oral Q4H PRN Clapacs, John T, MD      . magnesium hydroxide (MILK OF MAGNESIA) suspension 30 mL  30 mL Oral Daily PRN Clapacs, John T, MD      . OLANZapine zydis (ZYPREXA) disintegrating tablet 10 mg  10 mg Oral BID AC & HS Takyra Cantrall B, MD      . temazepam (RESTORIL)  capsule 15 mg  15 mg Oral QHS PRN Zeno Hickel B, MD       PTA Medications: No prescriptions prior to admission.    Musculoskeletal: Strength & Muscle Tone: within normal limits Gait & Station: normal Patient leans: N/A  Psychiatric Specialty Exam: Physical Exam  Nursing note and vitals reviewed. Constitutional: He is oriented to person, place, and time. He appears well-developed and well-nourished.  HENT:  Head: Normocephalic and atraumatic.  Eyes: Pupils are equal, round, and reactive to light. Conjunctivae and EOM are normal.  Neck: Normal range of motion. Neck supple.  Cardiovascular: Normal rate, regular rhythm and normal heart sounds.   Respiratory: Effort normal and breath sounds normal.  GI: Soft. Bowel sounds are normal.  Musculoskeletal: Normal range of motion.  Neurological: He is alert and oriented to person, place, and time.  Skin: Skin is warm and dry.  Psychiatric: His affect is angry and labile. His speech is rapid and/or pressured. He is agitated and hyperactive. Thought content is paranoid and delusional. Cognition and memory are normal. He expresses impulsivity. He expresses suicidal ideation.    Review of Systems  Constitutional: Negative.   HENT: Negative.   Eyes: Negative.   Respiratory: Negative.   Cardiovascular: Negative.   Gastrointestinal: Negative.   Genitourinary: Negative.   Musculoskeletal: Negative.   Skin: Negative.   Neurological: Negative.   Endo/Heme/Allergies: Negative.   Psychiatric/Behavioral: Positive for depression, substance abuse and suicidal ideas. The patient is  nervous/anxious and has insomnia.     Blood pressure 125/67, pulse (!) 51, temperature 98 F (36.7 C), resp. rate 16, height  (1.727 m), weight 63.5 kg (140 lb), SpO2 100 %.Body mass index is 21.29 kg/m.  See SRA.                                                  Sleep:  Number of Hours: 8.45    Treatment Plan Summary: Daily  contact with patient to assess and evaluate symptoms and progress in treatment and Medication management   Mr. Allerton is a 21 year old male with no past psychiatric history admitted floridly psychotic and suicidal.  1. Suicidal ideation. The patient is able to contract for safety in the hospital.  2. Mood and psychosis. He was started on Zyprexa for psychosis and Lithium for mood stabilization.  3. Insomnia. Restoril is available.  4. Metabolic syndrome monitoring. Lipid panel, TSH, and hemoglobin A1c are pending.  5. EKG. Pending.  6. Substance abuse. We do not have clear understanding of his use at present.  7. Disposition. He will be discharged to home with his family. He will follow up with a new provider.   Observation Level/Precautions:  15 minute checks  Laboratory:  CBC Chemistry Profile UDS UA  Psychotherapy:    Medications:    Consultations:    Discharge Concerns:    Estimated LOS:  Other:     Physician Treatment Plan for Primary Diagnosis: Bipolar I disorder, most recent episode mixed, severe with psychotic features (HCC) Long Term Goal(s): Improvement in symptoms so as ready for discharge  Short Term Goals: Ability to identify changes in lifestyle to reduce recurrence of condition will improve, Ability to verbalize feelings will improve, Ability to disclose and discuss suicidal ideas, Ability to demonstrate self-control will improve, Ability to identify and develop effective coping behaviors will improve, Ability to maintain clinical measurements within normal limits will improve, Compliance with prescribed medications will improve and Ability to identify triggers associated with substance abuse/mental health issues will improve  Physician Treatment Plan for Secondary Diagnosis: Principal Problem:   Bipolar I disorder, most recent episode mixed, severe with psychotic features (HCC)  Long Term Goal(s): Improvement in symptoms so as ready for discharge  Short  Term Goals: Ability to identify changes in lifestyle to reduce recurrence of condition will improve, Ability to demonstrate self-control will improve and Ability to identify triggers associated with substance abuse/mental health issues will improve  I certify that inpatient services furnished can reasonably be expected to improve the patient's condition.    Kristine Linea, MD 9/27/20185:55 PM

## 2017-07-30 NOTE — Plan of Care (Signed)
Problem: Coping: Goal: Ability to cope will improve Outcome: Progressing Pt coping skills will improve this shift.   

## 2017-07-30 NOTE — Progress Notes (Addendum)
Pleasant and cooperative.  Denies SI/HI/AVH.  Verbalizes that he is here because of his mother. Further states that he is not going to change his habits.  Up in dayroom.  Interacting with peers and staff appropriately.  Attending groups.    1637 Patient noted standing at the end of the hall with head down.  Asked if he was going to go eat. States "I was hungry but I am not anymore"  Asked if anything in particular happened to make him not want to eat, states "I just want to go home.  I am use to doing what I want to but someone is always with me" When asked who states "I don't want to talk about it, it is to complicated"  Talks in circles.  Laughing inappropriately.  States "I want to go out of the door but I know someone will tackle me"

## 2017-07-30 NOTE — [facility] Group Notes (Signed)
LCSW Group Therapy Note 07/30/2017 1:15pm  Type of Therapy and Topic:  Group Therapy:  Setting Goals  Participation Level:  Active  Description of Group: In this process group, patients discussed using strengths to work toward goals and address challenges.  Patients identified two positive things about themselves and one goal they were working on.  Patients were given the opportunity to share openly and support each other's plan for self-empowerment.  The group discussed the value of gratitude and were encouraged to have a daily reflection of positive characteristics or circumstances.  Patients were encouraged to identify a plan to utilize their strengths to work on current challenges and goals.  Therapeutic Goals 1. Patient will verbalize personal strengths/positive qualities and relate how these can assist with achieving desired personal goals 2. Patients will verbalize affirmation of peers plans for personal change and goal setting 3. Patients will explore the value of gratitude and positive focus as related to successful achievement of goals 4. Patients will verbalize a plan for regular reinforcement of personal positive qualities and circumstances.  Summary of Patient Progress:  Pt is focused on discharge, despite being admitted just yesterday. Pt is difficult to redirect, sarcastic, and distrusting. Pt identified a daily goal of, "Going home to visit my family."     Therapeutic Modalities Cognitive Behavioral Therapy Motivational Interviewing    Verdene Lennert, LCSW 07/30/2017 12:46 PM

## 2017-07-30 NOTE — [facility] Suicide Risk Assessment (Signed)
[facility] Admission Suicide Risk Assessment   Nursing information obtained from:    Demographic factors:    Current Mental Status:    Loss Factors:    Historical Factors:    Risk Reduction Factors:     Total Time spent with patient: 1 hour Principal Problem: Bipolar I disorder, most recent episode mixed, severe with psychotic features Darryl [facility]) Diagnosis:   Patient Active Problem List   Diagnosis Date Noted  . Bipolar I disorder, most recent episode mixed, severe with psychotic features (HCC) [F31.64] 07/29/2017  . 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  Continued Clinical Symptoms:  Alcohol Use Disorder Identification Test Final Score (AUDIT): 4 The "Alcohol Use Disorders Identification Test", Guidelines for Use in Primary Care, Second Edition.  World Science writer [facility]). Score between 0-7:  no or low risk or alcohol related problems. Score between 8-15:  moderate risk of alcohol related problems. Score between 16-19:  high risk of alcohol related problems. Score 20 or above:  warrants further diagnostic evaluation for alcohol dependence and treatment.   CLINICAL FACTORS:   Bipolar Disorder:   Mixed State Alcohol/Substance Abuse/Dependencies   Musculoskeletal: Strength & Muscle Tone: within normal limits Gait & Station: normal Patient leans: N/A  Psychiatric Specialty Exam: Physical Exam  Nursing note and vitals reviewed. Psychiatric: His affect is angry, labile and inappropriate. His speech is rapid and/or pressured. He is agitated and hyperactive. Thought content is paranoid and delusional. Cognition and memory are normal. He expresses impulsivity. He expresses suicidal ideation. He expresses suicidal plans.    Review of Systems  Constitutional: Negative.   HENT: Negative.   Eyes: Negative.   Respiratory: Negative.   Cardiovascular: Negative.   Gastrointestinal: Negative.   Genitourinary: Negative.   Musculoskeletal: Negative.    Skin: Negative.   Neurological: Negative.   Endo/Heme/Allergies: Negative.   Psychiatric/Behavioral: Positive for depression, substance abuse and suicidal ideas. The patient has insomnia.     Blood pressure 125/67, pulse (!) 51, temperature 98 F (36.7 C), resp. rate 16, height  (1.727 m), weight 63.5 kg (140 lb), SpO2 100 %.Body mass index is 21.29 kg/m.  General Appearance: Casual  Eye Contact:  Good  Speech:  Pressured  Volume:  Increased  Mood:  Angry, Dysphoric and Irritable  Affect:  Congruent  Thought Process:  Disorganized and Descriptions of Associations: Loose  Orientation:  Full (Time, Place, and Person)  Thought Content:  Delusions and Paranoid Ideation  Suicidal Thoughts:  Yes.  with intent/plan  Homicidal Thoughts:  No  Memory:  Immediate;   Fair Recent;   Fair Remote;   Fair  Judgement:  Poor  Insight:  Lacking  Psychomotor Activity:  Increased  Concentration:  Concentration: Fair and Attention Span: Fair  Recall:  [employer] of Knowledge:  Fair  Language:  Fair  Akathisia:  No  Handed:  Right  AIMS (if indicated):     Assets:  Communication Skills Desire for Improvement Financial Resources/Insurance Housing Physical Health Resilience Social Support Transportation Vocational/Educational  ADL's:  Intact  Cognition:  WNL  Sleep:  Number of Hours: 8.45      COGNITIVE FEATURES THAT CONTRIBUTE TO RISK:  None    SUICIDE RISK:   Severe:  Frequent, intense, and enduring suicidal ideation, specific plan, no subjective intent, but some objective markers of intent (i.e., choice of lethal method), the method is accessible, some limited preparatory behavior, evidence of impaired self-control, severe dysphoria/symptomatology, multiple risk factors present, and few if any protective  factors, particularly a lack of social support.  PLAN OF CARE: Hospital admission, medication management, substance abuse counseling, discharge planning.  Mr. Darryl Hughes is a  21 year old male with no past psychiatric history admitted floridly psychotic and suicidal.  1. Suicidal ideation. The patient is able to contract for safety in the hospital.  2. Mood and psychosis. He was started on Zyprexa for psychosis and Depakote for mood stabilization.  3. Insomnia. Restoril is available.  4. Metabolic syndrome monitoring. Lipid panel, TSH, and hemoglobin A1c are pending.  5. EKG. Pending.  6. Substance abuse. We do not have clear understanding of his use at present.  7. Disposition. He will be discharged to home with his family. He will follow up with a new provider.     I certify that inpatient services furnished can reasonably be expected to improve the patient's condition.   Kristine Linea, MD 07/30/2017, 5:43 PM

## 2017-07-30 NOTE — [facility] Suicide Risk Assessment (Signed)
[facility] INPATIENT:  Family/Significant Other Suicide Prevention Education  Suicide Prevention Education:  Education Completed; Lorne Winkels, mother, [phone], has been identified by the patient as the family member/significant other with whom the patient will be residing, and identified as the person(s) who will aid the patient in the event of a mental health crisis (suicidal ideations/suicide attempt).  With written consent from the patient, the family member/significant other has been provided the following suicide prevention education, prior to the and/or following the discharge of the patient.  The suicide prevention education provided includes the following:  Suicide risk factors  Suicide prevention and interventions  National Suicide Hotline telephone number  [facility] assessment telephone number  [facility] Emergency Assistance 911  [facility] and/or Residential Mobile Crisis Unit telephone number  Request made of family/significant other to:  Remove weapons (e.g., guns, rifles, knives), all items previously/currently identified as safety concern.  No guns in the home, per mother.  Remove drugs/medications (over-the-counter, prescriptions, illicit drugs), all items previously/currently identified as a safety concern. No excess medication in the home, per mother.  The family member/significant other verbalizes understanding of the suicide prevention education information provided.  The family member/significant other agrees to remove the items of safety concern listed above.  Pt appeared to have a religious experience about 6 weeks ago--talked about "seeing the light."  Since then, things have gotten worse and pt began making less and less sense--talked about performing with a famous music star, started referring to "she" all the time: She is talking to me, She won't leave me alone.  Mother still does not know who "she" is.  No history of mental illness in the  past.  Lorri Frederick, LCSW 07/30/2017, 3:23 PM

## 2017-07-30 NOTE — Progress Notes (Signed)
Pt very anxious. Pt encouraged to use coping skills. Pt pacing back and forth up the halls. Pt cooperative and was med compliant. Denies SI, HI or A/V hallucinations. Pt contracted to safety. 15 min safety checks continues.

## 2017-07-30 NOTE — Plan of Care (Signed)
Problem: Safety: Goal: Ability to disclose and discuss suicidal ideas will improve Outcome: Progressing Denies SI/HI/AVH.

## 2017-07-31 NOTE — Progress Notes (Signed)
[facility] MD Progress Note  07/31/2017 9:03 AM Darryl Hughes  MRN:  [id number]  Subjective:   History of present illness. Information was obtained from the patient and the chart. The patient was brought to the emergency room floridly psychotic and unable to provide any information. The family noticed change in his behavior 6 weks ago and suspected drug use. He was admitted to psychiatry unit with a diagnosis of psychotic break. Today the patient is still disorganized but able to participate in the interview. She reports that for the past week or so he has been extremely depressed, lonely, and suicidal. He came to the hospital to "get away from his family". He is very adamant to tell me that he no longer feels depressed or suicidal. He is only depressed about the fact that he is in the hospital. He demands to be discharged as soon as possible. He quickly understood the rules of voluntary and involuntary admission and wants to sign 72 hour form. He reports that prior to depressive episode he felt extremely happy. He denies substance use understands that this is what his mother believes. He seems somewhat paranoid but denies auditory or visual hallucinations or religious preoccupations. He is hyper, argumentative, edgy, and very sharp. He denies any changes in his sleep and believes that he can sleep 8 hours if he chooses to. He denies changes in appetite. He denies any symptoms of anxiety.  Past psychiatric history. Apparently there is no history of mental illness or substance abuse. The patient makes a comment that he is problems occurred very suddenly causing academic problems. He denies ever attempting suicide. There were no hospitalizations.  Family psychiatric history. The patient denies any knowledge of history of mental illness, substance abuse, or suicide in the family.  Social history. He lives with his parents. He dropped out of [employer] freshman year and tried to go to [employer] with little success.    07/31/2017. Mr. Darryl Hughes met with treatment team today. He is much more relaxed and childlike, laughing inappropriately. He took medications of Lithium and Zyprexa. He is mostly in his room where he likely hallucinates but participates in programming.   Per nursing: D: Pt is alert and oriented x 4, denies SI/HI/AVH, affect is flat but brightens upon approach. Pt is pleasant and cooperative no distress noted. Pt thoughts are organized no bizarre behavior during shift. Patient appears less anxious and he is interacting with peers and staff appropriately.  A: Pt was offered support and encouragement. Pt was encouraged to attend groups. Q 15 minute checks were done for safety.  R:Pt attends groups and interacts well with peers and staff.  Pt has no complaints.Pt receptive to treatment and safety maintained on unit.  Principal Problem: Bipolar I disorder, most recent episode mixed, severe with psychotic features ([place]) Diagnosis:   Patient Active Problem List   Diagnosis Date Noted  . Bipolar I disorder, most recent episode mixed, severe with psychotic features ([place]) [F31.64] 07/29/2017  . 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 No pertinent past medical history. History reviewed. No pertinent surgical history. Family History: History reviewed. No pertinent family history.  Social History:  History  Alcohol use Not on file     History  Drug use: Unknown    Social History   Social History  . Marital status: Single    Spouse name: N/A  . Number of children: N/A  . Years of education: N/A  Social History Main Topics  . Smoking status: None  . Smokeless tobacco: None  . Alcohol use None  . Drug use: Unknown  . Sexual activity: Not Asked   Other Topics Concern  . None   Social History Narrative  . None   Additional Social History:                         Sleep: Fair  Appetite:   Fair  Current Medications: Current Facility-Administered Medications  Medication Dose Route Frequency Provider Last Rate Last Dose  . acetaminophen (TYLENOL) tablet 650 mg  650 mg Oral Q6H PRN Clapacs, John T, MD      . alum & mag hydroxide-simeth (MAALOX/MYLANTA) 200-200-20 MG/5ML suspension 30 mL  30 mL Oral Q4H PRN Clapacs, John T, MD      . lithium carbonate (LITHOBID) CR tablet 300 mg  300 mg Oral TID AC Mackena Plummer B, MD   300 mg at 07/30/17 1846  . LORazepam (ATIVAN) tablet 2 mg  2 mg Oral Q4H PRN Clapacs, John T, MD      . magnesium hydroxide (MILK OF MAGNESIA) suspension 30 mL  30 mL Oral Daily PRN Clapacs, John T, MD      . OLANZapine zydis (ZYPREXA) disintegrating tablet 10 mg  10 mg Oral BID AC & HS Jakell Trusty B, MD   10 mg at 07/30/17 2200  . temazepam (RESTORIL) capsule 15 mg  15 mg Oral QHS PRN Jadasia Haws B, MD        Lab Results:  Results for orders placed or performed during the hospital encounter of 07/29/17 (from the past 48 hour(s))  Hemoglobin A1c     Status: None   Collection Time: 07/30/17  6:56 AM  Result Value Ref Range   Hgb A1c MFr Bld 4.9 4.8 - 5.6 %    Comment: (NOTE) Pre diabetes:          5.7%-6.4% Diabetes:              >6.4% Glycemic control for   <7.0% adults with diabetes    Mean Plasma Glucose 93.93 mg/dL    Comment: Performed at [facility], [place] [address], [place], [place] [address]  Lipid panel     Status: Abnormal   Collection Time: 07/30/17  6:56 AM  Result Value Ref Range   Cholesterol 135 0 - 200 mg/dL   Triglycerides 95 <150 mg/dL   HDL 32 (L) >40 mg/dL   Total CHOL/HDL Ratio 4.2 RATIO   VLDL 19 0 - 40 mg/dL   LDL Cholesterol 84 0 - 99 mg/dL    Comment:        Total Cholesterol/HDL:CHD Risk Coronary Heart Disease Risk Table                     Men   Women  1/2 Average Risk   3.4   3.3  Average Risk       5.0   4.4  2 X Average Risk   9.6   7.1  3 X Average Risk  23.4   11.0        Use the  calculated Patient Ratio above and the CHD Risk Table to determine the patient's CHD Risk.        ATP III CLASSIFICATION (LDL):  <100     mg/dL   Optimal  100-129  mg/dL   Near or Above  Optimal  130-159  mg/dL   Borderline  160-189  mg/dL   High  >190     mg/dL   Very High   TSH     Status: None   Collection Time: 07/30/17  6:56 AM  Result Value Ref Range   TSH 3.229 0.350 - 4.500 uIU/mL    Comment: Performed by a 3rd Generation assay with a functional sensitivity of <=0.01 uIU/mL.    Blood Alcohol level:  Lab Results  Component Value Date   ETH <10 (H) 60/08/9322    Metabolic Disorder Labs: Lab Results  Component Value Date   HGBA1C 4.9 07/30/2017   MPG 93.93 07/30/2017   No results found for: PROLACTIN Lab Results  Component Value Date   CHOL 135 07/30/2017   TRIG 95 07/30/2017   HDL 32 (L) 07/30/2017   CHOLHDL 4.2 07/30/2017   VLDL 19 07/30/2017   LDLCALC 84 07/30/2017    Physical Findings: AIMS:  , ,  ,  ,    CIWA:    COWS:     Musculoskeletal: Strength & Muscle Tone: within normal limits Gait & Station: normal Patient leans: N/A  Psychiatric Specialty Exam: Physical Exam  Nursing note and vitals reviewed. Psychiatric: His behavior is normal. His affect is labile. His speech is rapid and/or pressured. Cognition and memory are normal. He expresses impulsivity. He expresses suicidal ideation.    Review of Systems  Constitutional: Negative.   HENT: Negative.   Eyes: Negative.   Respiratory: Negative.   Cardiovascular: Negative.   Gastrointestinal: Negative.   Genitourinary: Negative.   Musculoskeletal: Negative.   Skin: Negative.   Neurological: Negative.   Endo/Heme/Allergies: Negative.   Psychiatric/Behavioral: The patient has insomnia.     Blood pressure 110/75, pulse 65, temperature 97.7 F (36.5 C), temperature source Oral, resp. rate 16, height 5' 8"  (1.727 m), weight 63.5 kg (140 lb), SpO2 100 %.Body mass index is  21.29 kg/m.  General Appearance: Casual  Eye Contact:  Good  Speech:  Pressured  Volume:  Normal  Mood:  Euphoric  Affect:  Inappropriate and Labile  Thought Process:  Goal Directed and Descriptions of Associations: Intact  Orientation:  Full (Time, Place, and Person)  Thought Content:  Hallucinations: Auditory  Suicidal Thoughts:  No  Homicidal Thoughts:  No  Memory:  Immediate;   Fair Recent;   Fair Remote;   Fair  Judgement:  Poor  Insight:  Lacking  Psychomotor Activity:  Increased and Restlessness  Concentration:  Concentration: Fair and Attention Span: Fair  Recall:  [employer] of Knowledge:  Fair  Language:  Fair  Akathisia:  No  Handed:  Right  AIMS (if indicated):     Assets:  Communication Skills Desire for Improvement Financial Resources/Insurance Housing Physical Health Resilience Social Support Transportation  ADL's:  Intact  Cognition:  WNL  Sleep:  Number of Hours: 6.75     Treatment Plan Summary: Daily contact with patient to assess and evaluate symptoms and progress in treatment and Medication management   Mr. Meeker is a 21 year old male with no past psychiatric history admitted floridly psychotic and suicidal.  1. Suicidal ideation. The patient is able to contract for safety in the hospital.  2. Mood and psychosis. He was started on Zyprexa for psychosis and Lithium for mood stabilization.  3. Insomnia. Restoril is available.  4. Metabolic syndrome monitoring. Lipid panel, TSH, and hemoglobin A1c are normal.  5. EKG. Pending.  6. Substance abuse. We do not have clear understanding  of his use yet.   7. Disposition. He will be discharged to home with his family. He will follow up with a new provider.   Orson Slick, MD 07/31/2017, 9:03 AM

## 2017-07-31 NOTE — Tx Team (Signed)
Interdisciplinary Treatment and Diagnostic Plan Update  07/31/2017 Time of Session: 1055 Darryl Hughes MRN: [id number]  Principal Diagnosis: Bipolar I disorder, most recent episode mixed, severe with psychotic features (HCC)  Secondary Diagnoses: Principal Problem:   Bipolar I disorder, most recent episode mixed, severe with psychotic features (HCC)   Current Medications:  Current Facility-Administered Medications  Medication Dose Route Frequency Provider Last Rate Last Dose  . acetaminophen (TYLENOL) tablet 650 mg  650 mg Oral Q6H PRN Clapacs, John T, MD      . alum & mag hydroxide-simeth (MAALOX/MYLANTA) 200-200-20 MG/5ML suspension 30 mL  30 mL Oral Q4H PRN Clapacs, John T, MD      . lithium carbonate (LITHOBID) CR tablet 300 mg  300 mg Oral TID AC Pucilowska, Jolanta B, MD   300 mg at 07/31/17 0800  . LORazepam (ATIVAN) tablet 2 mg  2 mg Oral Q4H PRN Clapacs, John T, MD      . magnesium hydroxide (MILK OF MAGNESIA) suspension 30 mL  30 mL Oral Daily PRN Clapacs, John T, MD      . OLANZapine zydis (ZYPREXA) disintegrating tablet 10 mg  10 mg Oral BID AC & HS Pucilowska, Jolanta B, MD   10 mg at 07/31/17 1003  . temazepam (RESTORIL) capsule 15 mg  15 mg Oral QHS PRN Pucilowska, Jolanta B, MD       PTA Medications: No prescriptions prior to admission.    Patient Stressors: Financial difficulties Medication change or noncompliance Substance abuse  Patient Strengths: Average or above average intelligence Supportive family/friends  Treatment Modalities: Medication Management, Group therapy, Case management,  1 to 1 session with clinician, Psychoeducation, Recreational therapy.   Physician Treatment Plan for Primary Diagnosis: Bipolar I disorder, most recent episode mixed, severe with psychotic features (HCC) Long Term Goal(s): Improvement in symptoms so as ready for discharge Improvement in symptoms so as ready for discharge   Short Term Goals: Ability to identify changes in  lifestyle to reduce recurrence of condition will improve Ability to verbalize feelings will improve Ability to disclose and discuss suicidal ideas Ability to demonstrate self-control will improve Ability to identify and develop effective coping behaviors will improve Ability to maintain clinical measurements within normal limits will improve Compliance with prescribed medications will improve Ability to identify triggers associated with substance abuse/mental health issues will improve Ability to identify changes in lifestyle to reduce recurrence of condition will improve Ability to demonstrate self-control will improve Ability to identify triggers associated with substance abuse/mental health issues will improve  Medication Management: Evaluate patient's response, side effects, and tolerance of medication regimen.  Therapeutic Interventions: 1 to 1 sessions, Unit Group sessions and Medication administration.  Evaluation of Outcomes: Not Progressing  Physician Treatment Plan for Secondary Diagnosis: Principal Problem:   Bipolar I disorder, most recent episode mixed, severe with psychotic features (HCC)  Long Term Goal(s): Improvement in symptoms so as ready for discharge Improvement in symptoms so as ready for discharge   Short Term Goals: Ability to identify changes in lifestyle to reduce recurrence of condition will improve Ability to verbalize feelings will improve Ability to disclose and discuss suicidal ideas Ability to demonstrate self-control will improve Ability to identify and develop effective coping behaviors will improve Ability to maintain clinical measurements within normal limits will improve Compliance with prescribed medications will improve Ability to identify triggers associated with substance abuse/mental health issues will improve Ability to identify changes in lifestyle to reduce recurrence of condition will improve Ability to demonstrate self-control  will  improve Ability to identify triggers associated with substance abuse/mental health issues will improve     Medication Management: Evaluate patient's response, side effects, and tolerance of medication regimen.  Therapeutic Interventions: 1 to 1 sessions, Unit Group sessions and Medication administration.  Evaluation of Outcomes: Not Progressing   RN Treatment Plan for Primary Diagnosis: Bipolar I disorder, most recent episode mixed, severe with psychotic features (HCC) Long Term Goal(s): Knowledge of disease and therapeutic regimen to maintain health will improve  Short Term Goals: Ability to identify and develop effective coping behaviors will improve and Compliance with prescribed medications will improve  Medication Management: RN will administer medications as ordered by provider, will assess and evaluate patient's response and provide education to patient for prescribed medication. RN will report any adverse and/or side effects to prescribing provider.  Therapeutic Interventions: 1 on 1 counseling sessions, Psychoeducation, Medication administration, Evaluate responses to treatment, Monitor vital signs and CBGs as ordered, Perform/monitor CIWA, COWS, AIMS and Fall Risk screenings as ordered, Perform wound care treatments as ordered.  Evaluation of Outcomes: Not Progressing   LCSW Treatment Plan for Primary Diagnosis: Bipolar I disorder, most recent episode mixed, severe with psychotic features (HCC) Long Term Goal(s): Safe transition to appropriate next level of care at discharge, Engage patient in therapeutic group addressing interpersonal concerns.  Short Term Goals: Engage patient in aftercare planning with referrals and resources and Increase skills for wellness and recovery  Therapeutic Interventions: Assess for all discharge needs, 1 to 1 time with Social worker, Explore available resources and support systems, Assess for adequacy in community support network, Educate family and  significant other(s) on suicide prevention, Complete Psychosocial Assessment, Interpersonal group therapy.  Evaluation of Outcomes: Not Progressing   Progress in Treatment: Attending groups: Yes. Participating in groups: Yes. Taking medication as prescribed: Yes. Toleration medication: Yes. Family/Significant other contact made: Yes, individual(s) contacted:  mother Patient understands diagnosis: No. Discussing patient identified problems/goals with staff: Yes. Medical problems stabilized or resolved: Yes. Denies suicidal/homicidal ideation: Yes. Issues/concerns per patient self-inventory: Yes. Other: none  New problem(s) identified: No, Describe:  none  New Short Term/Long Term Goal(s):Pt goal: "get better."  Discharge Plan or Barriers: Pt will be referred to [facility].  Reason for Continuation of Hospitalization: Delusions  Medication stabilization  Estimated Length of Stay: 7 days.  Attendees: Patient:Darryl Hughes 07/31/2017   Physician: Dr. Jennet Maduro, MD 07/31/2017  Nursing: Leonia Reader, RN 07/31/2017  RN Care Manager: 07/31/2017   Social Worker: Daleen Squibb, LCSW 07/31/2017   Recreational Therapist:  07/31/2017   Other:  07/31/2017   Other:  07/31/2017   Other: 07/31/2017        Scribe for Treatment Team: Lorri Frederick, LCSW 07/31/2017 2:42 PM

## 2017-07-31 NOTE — [facility] Group Notes (Signed)
[facility] Group Notes:  (Nursing/MHT/Case Management/Adjunct)  Date:  07/31/2017  Time:  12:13 AM  Type of Therapy:  Evening Wrap-up Group  Participation Level:  Did Not Attend  Participation Quality:  N/A  Affect:  N/A  Cognitive:  N/A  Insight:  None  Engagement in Group:  Did Not Attend  Modes of Intervention:  Discussion  Summary of Progress/Problems:  Tomasita Morrow 07/31/2017, 12:13 AM

## 2017-07-31 NOTE — Progress Notes (Signed)
Pleasant and cooperative.  Medication and group compliant.  Out of room to dayroom periodically throughout the day.  Minimal interaction noted with peers.  Continues to have some inappropriate laughter.  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  Support and encouragement offered.  Safety checks maintained.

## 2017-07-31 NOTE — Plan of Care (Signed)
Problem: Safety: Goal: Ability to remain free from injury will improve Outcome: Progressing Remains safe on the unit   

## 2017-07-31 NOTE — [facility] Group Notes (Signed)
[facility] LCSW Group Therapy Note  Date/Time: 07/31/17, 1300  Type of Therapy and Topic:  Group Therapy:  Feelings around Relapse and Recovery  Participation Level:  Active   Mood: somewhat irritated  Description of Group:    Patients in this group will discuss emotions they experience before and after a relapse. They will process how experiencing these feelings, or avoidance of experiencing them, relates to having a relapse. Facilitator will guide patients to explore emotions they have related to recovery. Patients will be encouraged to process which emotions are more powerful. They will be guided to discuss the emotional reaction significant others in their lives may have to patients' relapse or recovery. Patients will be assisted in exploring ways to respond to the emotions of others without this contributing to a relapse.  Therapeutic Goals: 1. Patient will identify two or more emotions that lead to relapse for them:  2. Patient will identify two emotions that result when they relapse:  3. Patient will identify two emotions related to recovery:  4. Patient will demonstrate ability to communicate their needs through discussion and/or role plays.   Summary of Patient Progress: Pt took part in group and made a number of contributions but continues to speak in bery lofty terms, describing himself as a "bird in a cage".  Pt appeared a little angry about being "locked up."     Therapeutic Modalities:   Cognitive Behavioral Therapy Solution-Focused Therapy Assertiveness Training Relapse Prevention Therapy  Daleen Squibb, LCSW

## 2017-07-31 NOTE — Progress Notes (Signed)
D: Pt is alert and oriented x 4, denies SI/HI/AVH, affect is flat but brightens upon approach. Pt is pleasant and cooperative no distress noted. Pt thoughts are organized no bizarre behavior during shift. Patient appears less anxious and he is interacting with peers and staff appropriately.  A: Pt was offered support and encouragement. Pt was encouraged to attend groups. Q 15 minute checks were done for safety.  R:Pt attends groups and interacts well with peers and staff.  Pt has no complaints.Pt receptive to treatment and safety maintained on unit.

## 2017-08-01 NOTE — [facility] Group Notes (Signed)
LCSW Group Therapy Note  08/01/2017 1:00pm  Type of Therapy and Topic:  Group Therapy:  Cognitive Distortions  Participation Level:  Minimal   Description of Group:    Patients in this group will be introduced to the topic of cognitive distortions.  Patients will identify and describe cognitive distortions, describe the feelings these distortions create for them.  Patients will identify one or more situations in their personal life where they have cognitively distorted thinking and will verbalize challenging this cognitive distortion through positive thinking skills.  Patients will practice the skill of using positive affirmations to challenge cognitive distortions using affirmation cards.    Therapeutic Goals:  1. Patient will identify two or more cognitive distortions they have used 2. Patient will identify one or more emotions that stem from use of a cognitive distortion 3. Patient will demonstrate use of a positive affirmation to counter a cognitive distortion through discussion and/or role play. 4. Patient will describe one way cognitive distortions can be detrimental to wellness   Summary of Patient Progress:  Pt participated minimally in group discussion but participated and able to meet therapeutic goals listed above.   Therapeutic Modalities:   Cognitive Behavioral Therapy Motivational Interviewing   Glennon Mac, LCSW 08/01/2017 2:28 PM

## 2017-08-01 NOTE — Plan of Care (Signed)
Problem: Safety: Goal: Ability to disclose and discuss suicidal ideas will improve Outcome: Progressing Denies SI   

## 2017-08-01 NOTE — Plan of Care (Signed)
Problem: Coping: Goal: Ability to cope will improve Outcome: Progressing Patient was approached on initial rounds for assessment.  He reported "having reached the top of the mountain" (referring to feeling less depressed) and states "but staying here any longer is going to make me backslide to the bottom".  He described being homesick and missing his family, friends and dog.  He had visitors this evening and admitted that seeing them made him not want to be here "even more".  He did participate in processing his feelings although he was mildly pressured in speech.  He acknowledged understanding that it is helpful to stay long enough for team to see the therapeutic benefits of treatments.  He attended wrap up group and had snacks with peers.  He requested Ativan for anxiety of 7/10 at 2130.  He was observed sleeping by 2230.  Problem: Education: Goal: Knowledge of disease or condition will improve Outcome: Progressing Patient asked appropriate questions about medications and demonstrates acceptance and understanding.

## 2017-08-01 NOTE — Progress Notes (Signed)
Affect constricted. Denies SI/HI/AVH.  Minimal interaction. Tends to isolate to self.  Verbalizes that if he stays here he is going to get worse.  Further states that everyone is trying to change him.  States that he just wants to sleep and does not see anything wrong with that. No interaction noted with peers.  Minimal interaction with staff.  Support offered.  Safety rounds maintained.

## 2017-08-01 NOTE — Progress Notes (Signed)
[facility] MD Progress Note  08/01/2017 2:42 PM Darryl Hughes  MRN:  [id number]  Subjective:   History of present illness. Information was obtained from the patient and the chart. The patient was brought to the emergency room floridly psychotic and unable to provide any information. The family noticed change in his behavior 6 weks ago and suspected drug use. He was admitted to psychiatry unit with a diagnosis of psychotic break. Today the patient is still disorganized but able to participate in the interview. She reports that for the past week or so he has been extremely depressed, lonely, and suicidal. He came to the hospital to "get away from his family". He is very adamant to tell me that he no longer feels depressed or suicidal. He is only depressed about the fact that he is in the hospital. He demands to be discharged as soon as possible. He quickly understood the rules of voluntary and involuntary admission and wants to sign 72 hour form. He reports that prior to depressive episode he felt extremely happy. He denies substance use understands that this is what his mother believes. He seems somewhat paranoid but denies auditory or visual hallucinations or religious preoccupations. He is hyper, argumentative, edgy, and very sharp. He denies any changes in his sleep and believes that he can sleep 8 hours if he chooses to. He denies changes in appetite. He denies any symptoms of anxiety.  Past psychiatric history. Apparently there is no history of mental illness or substance abuse. The patient makes a comment that he is problems occurred very suddenly causing academic problems. He denies ever attempting suicide. There were no hospitalizations.  Family psychiatric history. The patient denies any knowledge of history of mental illness, substance abuse, or suicide in the family.  Social history. He lives with his parents. He dropped out of [employer] freshman year and tried to go to [employer] [facility] [employer] [facility] [employer] with little success.    07/31/2017. Darryl Hughes met with treatment team today. He is much more relaxed and childlike, laughing inappropriately. He took medications of Lithium and Zyprexa. He is mostly in his room where he likely hallucinates but participates in programming.  Follow-up for Sunday the 29th. Patient seen chart reviewed. Patient seems to be much calmer than he was yesterday. He has been in bed much of the day and is not intrusive or up about the unit. He tells me that he is feeling tired but that he feels well now and wants to go home. 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 Still somewhat delusional but at least he was able to keep it together enough to make sense. Complains of sedation from the medicine but was easily aroused and does not appear to be delirious. Vitals stable.   Per nursing: D: Pt is alert and oriented x 4, denies SI/HI/AVH, affect is flat but brightens upon approach. Pt is pleasant and cooperative no distress noted. Pt thoughts are organized no bizarre behavior during shift. Patient appears less anxious and he is interacting with peers and staff appropriately.  A: Pt was offered support and encouragement. Pt was encouraged to attend groups. Q 15 minute checks were done for safety.  R:Pt attends groups and interacts well with peers and staff.  Pt has no complaints.Pt receptive to treatment and safety maintained on unit.  Principal Problem: Bipolar I disorder, most recent episode mixed, severe with psychotic features [facility]) Diagnosis:   Patient Active Problem List  Diagnosis Date Noted  . Bipolar I disorder, most recent episode mixed, severe with psychotic features ([place]) [F31.64] 07/29/2017  . 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 No pertinent past medical history. History reviewed. No pertinent surgical history. Family History: History reviewed. No pertinent  family history.  Social History:  History  Alcohol use Not on file     History  Drug use: Unknown    Social History   Social History  . Marital status: Single    Spouse name: N/A  . Number of children: N/A  . Years of education: N/A   Social History Main Topics  . Smoking status: None  . Smokeless tobacco: None  . Alcohol use None  . Drug use: Unknown  . Sexual activity: Not Asked   Other Topics Concern  . None   Social History Narrative  . None   Additional Social History:                         Sleep: Fair  Appetite:  Fair  Current Medications: Current Facility-Administered Medications  Medication Dose Route Frequency Provider Last Rate Last Dose  . acetaminophen (TYLENOL) tablet 650 mg  650 mg Oral Q6H PRN ,  T, MD      . alum & mag hydroxide-simeth (MAALOX/MYLANTA) 200-200-20 MG/5ML suspension 30 mL  30 mL Oral Q4H PRN ,  T, MD      . lithium carbonate (LITHOBID) CR tablet 300 mg  300 mg Oral TID AC Pucilowska, Jolanta B, MD   300 mg at 08/01/17 1259  . LORazepam (ATIVAN) tablet 2 mg  2 mg Oral Q4H PRN , Madie Reno, MD   2 mg at 07/31/17 2144  . magnesium hydroxide (MILK OF MAGNESIA) suspension 30 mL  30 mL Oral Daily PRN ,  T, MD      . OLANZapine zydis (ZYPREXA) disintegrating tablet 10 mg  10 mg Oral BID AC & HS Pucilowska, Jolanta B, MD   10 mg at 08/01/17 0930  . temazepam (RESTORIL) capsule 15 mg  15 mg Oral QHS PRN Pucilowska, Jolanta B, MD        Lab Results:  No results found for this or any previous visit (from the past 48 hour(s)).  Blood Alcohol level:  Lab Results  Component Value Date   ETH <10 (H) 50/53/9767    Metabolic Disorder Labs: Lab Results  Component Value Date   HGBA1C 4.9 07/30/2017   MPG 93.93 07/30/2017   No results found for: PROLACTIN Lab Results  Component Value Date   CHOL 135 07/30/2017   TRIG 95 07/30/2017   HDL 32 (L) 07/30/2017   CHOLHDL 4.2 07/30/2017   VLDL 19  07/30/2017   LDLCALC 84 07/30/2017    Physical Findings: AIMS:  , ,  ,  ,    CIWA:    COWS:     Musculoskeletal: Strength & Muscle Tone: within normal limits Gait & Station: normal Patient leans: N/A  Psychiatric Specialty Exam: Physical Exam  Nursing note and vitals reviewed. Constitutional: He appears well-developed and well-nourished.  HENT:  Head: Normocephalic and atraumatic.  Eyes: Pupils are equal, round, and reactive to light. Conjunctivae are normal.  Neck: Normal range of motion.  Cardiovascular: Regular rhythm and normal heart sounds.   Respiratory: Effort normal. No respiratory distress.  GI: Soft.  Musculoskeletal: Normal range of motion.  Neurological: He is alert.  Skin: Skin is warm and dry.  Psychiatric: His behavior is normal. His affect is blunt. His affect is not labile. His speech is rapid and/or pressured. Thought content is paranoid. Cognition and memory are normal. He expresses impulsivity. He expresses no suicidal ideation.    Review of Systems  Constitutional: Negative.   HENT: Negative.   Eyes: Negative.   Respiratory: Negative.   Cardiovascular: Negative.   Gastrointestinal: Negative.   Genitourinary: Negative.   Musculoskeletal: Negative.   Skin: Negative.   Neurological: Negative.   Endo/Heme/Allergies: Negative.   Psychiatric/Behavioral: The patient has insomnia.     Blood pressure 111/76, pulse 65, temperature 97.8 F (36.6 C), resp. rate 16, height 5' 8" (1.727 m), weight 63.5 kg (140 lb), SpO2 100 %.Body mass index is 21.29 kg/m.  General Appearance: Casual  Eye Contact:  Good  Speech:  Pressured  Volume:  Normal  Mood:  Euphoric  Affect:  Inappropriate and Labile  Thought Process:  Goal Directed and Descriptions of Associations: Intact  Orientation:  Full (Time, Place, and Person)  Thought Content:  Hallucinations: Auditory  Suicidal Thoughts:  No  Homicidal Thoughts:  No  Memory:  Immediate;   Fair Recent;   Fair Remote;    Fair  Judgement:  Poor  Insight:  Lacking  Psychomotor Activity:  Increased and Restlessness  Concentration:  Concentration: Fair and Attention Span: Fair  Recall:  [employer] of Knowledge:  Fair  Language:  Fair  Akathisia:  No  Handed:  Right  AIMS (if indicated):     Assets:  Communication Skills Desire for Improvement Financial Resources/Insurance Housing Physical Health Resilience Social Support Transportation  ADL's:  Intact  Cognition:  WNL  Sleep:  Number of Hours: 7.3     Treatment Plan Summary: Daily contact with patient to assess and evaluate symptoms and progress in treatment and Medication management   Darryl Hughes is a 21 year old male with no past psychiatric history admitted floridly psychotic and suicidal.  1. Suicidal ideation. The patient is able to contract for safety in the hospital.  2. Mood and psychosis. He was started on Zyprexa for psychosis and Lithium for mood stabilization.  3. Insomnia. Restoril is available.  4. Metabolic syndrome monitoring. Lipid panel, TSH, and hemoglobin A1c are normal.  5. EKG. Pending.  6. Substance abuse. We do not have clear understanding of his use yet.   7. Disposition. He will be discharged to home with his family. He will follow up with a new provider.   Patient seems to be gradually improving. He is much more communicative than when he first came in. Still seems psychotic even though the most manic parts of calm down. No change to medicine today.  Alethia Berthold, MD 08/01/2017, 2:42 PM

## 2017-08-01 NOTE — [facility] Group Notes (Signed)
[facility] Group Notes:  (Nursing/MHT/Case Management/Adjunct)  Date:  08/01/2017  Time:  11:53 PM  Type of Therapy:  Evening Wrap-up Group  Participation Level:  Minimal  Participation Quality:  Appropriate and Attentive  Affect:  Appropriate  Cognitive:  Alert and Appropriate  Insight:  Appropriate  Engagement in Group:  Developing/Improving and Engaged  Modes of Intervention:  Discussion  Summary of Progress/Problems:  Darryl Hughes 08/01/2017, 11:53 PM

## 2017-08-02 DIAGNOSIS — Z5181 Encounter for therapeutic drug level monitoring: Secondary | ICD-10-CM

## 2017-08-02 NOTE — Plan of Care (Signed)
Problem: Safety: Goal: Ability to disclose and discuss suicidal ideas will improve Outcome: Progressing Patient was able to verbalize he is not having thoughts of self harm today.  He remains free from injury and remains safe on the unit.

## 2017-08-02 NOTE — Progress Notes (Signed)
[facility] MD Progress Note  08/02/2017 3:28 PM Darryl Hughes  MRN:  [id number]  Subjective:   History of present illness. Information was obtained from the patient and the chart. The patient was brought to the emergency room floridly psychotic and unable to provide any information. The family noticed change in his behavior 6 weks ago and suspected drug use. He was admitted to psychiatry unit with a diagnosis of psychotic break. Today the patient is still disorganized but able to participate in the interview. She reports that for the past week or so he has been extremely depressed, lonely, and suicidal. He came to the hospital to "get away from his family". He is very adamant to tell me that he no longer feels depressed or suicidal. He is only depressed about the fact that he is in the hospital. He demands to be discharged as soon as possible. He quickly understood the rules of voluntary and involuntary admission and wants to sign 72 hour form. He reports that prior to depressive episode he felt extremely happy. He denies substance use understands that this is what his mother believes. He seems somewhat paranoid but denies auditory or visual hallucinations or religious preoccupations. He is hyper, argumentative, edgy, and very sharp. He denies any changes in his sleep and believes that he can sleep 8 hours if he chooses to. He denies changes in appetite. He denies any symptoms of anxiety.  Past psychiatric history. Apparently there is no history of mental illness or substance abuse. The patient makes a comment that he is problems occurred very suddenly causing academic problems. He denies ever attempting suicide. There were no hospitalizations.  Family psychiatric history. The patient denies any knowledge of history of mental illness, substance abuse, or suicide in the family.  Social history. He lives with his parents. He dropped out of [employer] freshman year and tried to go to [employer] with little success.    07/31/2017. Darryl Hughes met with treatment team today. He is much more relaxed and childlike, laughing inappropriately. He took medications of Lithium and Zyprexa. He is mostly in his room where he likely hallucinates but participates in programming.  Follow-up for Sunday the 29th. Patient seen chart reviewed. Patient seems to be much calmer than he was yesterday. He has been in bed much of the day and is not intrusive or up about the unit. He tells me that he is feeling tired but that he feels well now and wants to go home. 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 Still somewhat delusional but at least he was able to keep it together enough to make sense. Complains of sedation from the medicine but was easily aroused and does not appear to be delirious. Vitals stable.   Follow-up for the 30th. Patient has no new complaints. He still seems a little odd in conversation slow and distracted but not bizarre. Able to answer questions reasonably well. Denies suicidal thoughts denies hallucinations. No new complaints  Per nursing: D: Pt is alert and oriented x 4, denies SI/HI/AVH, affect is flat but brightens upon approach. Pt is pleasant and cooperative no distress noted. Pt thoughts are organized no bizarre behavior during shift. Patient appears less anxious and he is interacting with peers and staff appropriately.  A: Pt was offered support and encouragement. Pt was encouraged to attend groups. Q 15 minute checks were done for safety.  R:Pt attends groups and interacts well with peers and  staff.  Pt has no complaints.Pt receptive to treatment and safety maintained on unit.  Principal Problem: Bipolar I disorder, most recent episode mixed, severe with psychotic features ([place]) Diagnosis:   Patient Active Problem List   Diagnosis Date Noted  . Bipolar I disorder, most recent episode mixed, severe with psychotic features ([place]) [F31.64] 07/29/2017  . 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 No pertinent past medical history. History reviewed. No pertinent surgical history. Family History: History reviewed. No pertinent family history.  Social History:  History  Alcohol use Not on file     History  Drug use: Unknown    Social History   Social History  . Marital status: Single    Spouse name: N/A  . Number of children: N/A  . Years of education: N/A   Social History Main Topics  . Smoking status: None  . Smokeless tobacco: None  . Alcohol use None  . Drug use: Unknown  . Sexual activity: Not Asked   Other Topics Concern  . None   Social History Narrative  . None   Additional Social History:                         Sleep: Fair  Appetite:  Fair  Current Medications: Current Facility-Administered Medications  Medication Dose Route Frequency Provider Last Rate Last Dose  . acetaminophen (TYLENOL) tablet 650 mg  650 mg Oral Q6H PRN Clapacs, John T, MD      . alum & mag hydroxide-simeth (MAALOX/MYLANTA) 200-200-20 MG/5ML suspension 30 mL  30 mL Oral Q4H PRN Clapacs, John T, MD      . lithium carbonate (LITHOBID) CR tablet 300 mg  300 mg Oral TID AC Pucilowska, Jolanta B, MD   300 mg at 08/02/17 1215  . LORazepam (ATIVAN) tablet 2 mg  2 mg Oral Q4H PRN Clapacs, Madie Reno, MD   2 mg at 08/01/17 2109  . magnesium hydroxide (MILK OF MAGNESIA) suspension 30 mL  30 mL Oral Daily PRN Clapacs, John T, MD      . OLANZapine zydis (ZYPREXA) disintegrating tablet 10 mg  10 mg Oral BID AC & HS Pucilowska, Jolanta B, MD   10 mg at 08/02/17 7371  . temazepam (RESTORIL) capsule 15 mg  15 mg Oral QHS PRN Pucilowska, Jolanta B, MD        Lab Results:  No results found for this or any previous visit (from the past 48 hour(s)).  Blood Alcohol level:  Lab Results  Component Value Date   ETH <10 (H) 04/28/9484    Metabolic Disorder Labs: Lab Results   Component Value Date   HGBA1C 4.9 07/30/2017   MPG 93.93 07/30/2017   No results found for: PROLACTIN Lab Results  Component Value Date   CHOL 135 07/30/2017   TRIG 95 07/30/2017   HDL 32 (L) 07/30/2017   CHOLHDL 4.2 07/30/2017   VLDL 19 07/30/2017   LDLCALC 84 07/30/2017    Physical Findings: AIMS:  , ,  ,  ,    CIWA:    COWS:     Musculoskeletal: Strength & Muscle Tone: within normal limits Gait & Station: normal Patient leans: N/A  Psychiatric Specialty Exam: Physical Exam  Nursing note and vitals reviewed. Constitutional: He appears well-developed and well-nourished.  HENT:  Head: Normocephalic and atraumatic.  Eyes: Pupils are equal, round, and reactive to light. Conjunctivae are  normal.  Neck: Normal range of motion.  Cardiovascular: Regular rhythm and normal heart sounds.   Respiratory: Effort normal. No respiratory distress.  GI: Soft.  Musculoskeletal: Normal range of motion.  Neurological: He is alert.  Skin: Skin is warm and dry.  Psychiatric: His behavior is normal. His affect is blunt. His affect is not labile. His speech is not rapid and/or pressured. Thought content is not paranoid. Cognition and memory are normal. He does not express impulsivity. He expresses no suicidal ideation.    Review of Systems  Constitutional: Negative.   HENT: Negative.   Eyes: Negative.   Respiratory: Negative.   Cardiovascular: Negative.   Gastrointestinal: Negative.   Genitourinary: Negative.   Musculoskeletal: Negative.   Skin: Negative.   Neurological: Negative.   Endo/Heme/Allergies: Negative.   Psychiatric/Behavioral: Negative for hallucinations. The patient does not have insomnia.     Blood pressure 110/75, pulse 63, temperature 97.7 F (36.5 C), resp. rate 16, height _0  (1.727 m), weight 63.5 kg (140 lb), SpO2 100 %.Body mass index is 21.29 kg/m.  General Appearance: Casual  Eye Contact:  Good  Speech:  Pressured  Volume:  Normal  Mood:  Euphoric   Affect:  Inappropriate and Labile  Thought Process:  Goal Directed and Descriptions of Associations: Intact  Orientation:  Full (Time, Place, and Person)  Thought Content:  Hallucinations: Auditory  Suicidal Thoughts:  No  Homicidal Thoughts:  No  Memory:  Immediate;   Fair Recent;   Fair Remote;   Fair  Judgement:  Poor  Insight:  Lacking  Psychomotor Activity:  Increased and Restlessness  Concentration:  Concentration: Fair and Attention Span: Fair  Recall:  [employer] of Knowledge:  Fair  Language:  Fair  Akathisia:  No  Handed:  Right  AIMS (if indicated):     Assets:  Communication Skills Desire for Improvement Financial Resources/Insurance Housing Physical Health Resilience Social Support Transportation  ADL's:  Intact  Cognition:  WNL  Sleep:  Number of Hours: 7.15     Treatment Plan Summary: Daily contact with patient to assess and evaluate symptoms and progress in treatment and Medication management   Darryl Hughes is a 21 year old male with no past psychiatric history admitted floridly psychotic and suicidal.  1. Suicidal ideation. The patient is able to contract for safety in the hospital.  2. Mood and psychosis. He was started on Zyprexa for psychosis and Lithium for mood stabilization.  3. Insomnia. Restoril is available.  4. Metabolic syndrome monitoring. Lipid panel, TSH, and hemoglobin A1c are normal.  5. EKG. Pending.  6. Substance abuse. We do not have clear understanding of his use yet.   7. Disposition. He will be discharged to home with his family. He will follow up with a new provider.   Patient seems to be gradually improving. He is much more communicative than when he first came in. Still seems psychotic even though the most manic parts of calm down. No change to medicine today.  Alethia Berthold, MD 08/02/2017, 3:28 PM

## 2017-08-02 NOTE — [facility] Group Notes (Signed)
LCSW Group Therapy Note  08/02/2017 2:45pm  Type of Therapy/Topic:  Group Therapy:  Balance in Life  Participation Level:  Active  Description of Group:   This group will address the concept of balance and how it feels and looks when one is unbalanced. Patients will be encouraged to process areas in their lives that are out of balance and identify reasons for remaining unbalanced. Facilitators will guide patients in utilizing problem-solving interventions to address and correct the stressor making their life unbalanced. Understanding and applying boundaries will be explored and addressed for obtaining and maintaining a balanced life. Patients will be encouraged to explore ways to assertively make their unbalanced needs known to significant others in their lives, using other group members and facilitator for support and feedback.  Therapeutic Goals: 1. Patient will identify two or more emotions or situations they have that consume much of in their lives. 2. Patient will identify signs/triggers that life has become out of balance:  3. Patient will identify two ways to set boundaries in order to achieve balance in their lives:  4. Patient will demonstrate ability to communicate their needs through discussion and/or role plays  Summary of Patient Progress: Pt attended group and stayed the entire time. Pt participated in activity and was supportive of other patients     Therapeutic Modalities:   Cognitive Behavioral Therapy Solution-Focused Therapy Assertiveness Training  Bryant Lipps L Tanishka Drolet, LCSW 08/02/2017 11:05 AM   

## 2017-08-02 NOTE — Progress Notes (Signed)
D: Patient is pleasant with staff; he has minimal interaction with staff and peers.  He asked, "how long do I have to take my lithium."  He states it is helping and understands the need to keep taking it.  Patient continues to be focused on discharge.  He rates his depression as 2; hopelessness as a 4 and anxiety as a 7.  He denies any thoughts of self harm.  Patient remains isolative to his room.   A: Continue to monitor mediation management and MD orders.  Safety checks continued every 15 minutes per protocol.  Offer support and encouragement as needed. R: Patient is receptive to staff; his behavior is appropriate.

## 2017-08-02 NOTE — Plan of Care (Signed)
Problem: Education: Goal: Knowledge of [facility] Education information/materials will improve Outcome: Progressing Patient was approached on initial rounds and showed he recognized Clinical research associate from yesterday.  He was given information about plans for wrap up group and agreed to participate.  Mood was calm.  Problem: Coping: Goal: Ability to cope will improve Outcome: Progressing Patient continues to focus on wanting discharge but is able to consider the need to remain in the hospital while medications take effect. He engages with staff in conversation about mental health. Goal: Ability to verbalize feelings will improve Outcome: Progressing Patient continues to spend most of his time in his room but attended wrap up group when encouraged. When a peer was having a behavioral crisis, Darryl Hughes attempted to offer support and was given redirection.  He later was able to identify feeling "upset" and "worried" .  He was given Ativan for anxiety at his request.  This was effective.

## 2017-08-02 NOTE — Plan of Care (Signed)
Problem: Coping: Goal: Ability to cope will improve Outcome: Progressing Patient continues to spend much of his time in his bed on evenings.  On approach, he is pleasant and engages in conversation with Clinical research associate. He spent time in the dayroom for group and evening snack.  He later commented that he felt "less anxious when out in the dayroom" and "more anxiety when getting ready to go to sleep".  He could not identify a specific trigger for his anxiety.  Problem: Safety: Goal: Ability to disclose and discuss suicidal ideas will improve Outcome: Progressing Patient denies SI/HI/AVH.  He was not heard responding to unseen others this evening. Patient is observed on routine rounds and safety is maintained.

## 2017-08-03 LAB — LITHIUM LEVEL: LITHIUM LVL: 0.56 mmol/L — AB (ref 0.60–1.20)

## 2017-08-03 MED ORDER — LITHIUM CARBONATE ER 300 MG PO TBCR
600.0000 mg | EXTENDED_RELEASE_TABLET | Freq: Two times a day (BID) | ORAL | Status: DC
  Administered 2017-08-03 – 2017-08-06 (×6): 600 mg via ORAL
  Filled 2017-08-03 (×6): qty 2

## 2017-08-03 NOTE — Plan of Care (Signed)
Problem: Coping: Goal: Ability to cope will improve Outcome: Progressing Patient exhibits good coping skills to manage his anxiety.

## 2017-08-03 NOTE — Progress Notes (Signed)
[facility] MD Progress Note  08/03/2017 4:38 PM Darryl Hughes  MRN:  [id number]  Subjective:   History of present illness. Information was obtained from the patient and the chart. The patient was brought to the emergency room floridly psychotic and unable to provide any information. The family noticed change in his behavior 6 weks ago and suspected drug use. He was admitted to psychiatry unit with a diagnosis of psychotic break. Today the patient is still disorganized but able to participate in the interview. She reports that for the past week or so he has been extremely depressed, lonely, and suicidal. He came to the hospital to "get away from his family". He is very adamant to tell me that he no longer feels depressed or suicidal. He is only depressed about the fact that he is in the hospital. He demands to be discharged as soon as possible. He quickly understood the rules of voluntary and involuntary admission and wants to sign 72 hour form. He reports that prior to depressive episode he felt extremely happy. He denies substance use understands that this is what his mother believes. He seems somewhat paranoid but denies auditory or visual hallucinations or religious preoccupations. He is hyper, argumentative, edgy, and very sharp. He denies any changes in his sleep and believes that he can sleep 8 hours if he chooses to. He denies changes in appetite. He denies any symptoms of anxiety.  Past psychiatric history. Apparently there is no history of mental illness or substance abuse. The patient makes a comment that he is problems occurred very suddenly causing academic problems. He denies ever attempting suicide. There were no hospitalizations.  Family psychiatric history. The patient denies any knowledge of history of mental illness, substance abuse, or suicide in the family.  Social history. He lives with his parents. He dropped out of [employer] freshman year and tried to go to [employer] with little success.    07/31/2017. Darryl Hughes met with treatment team today. He is much more relaxed and childlike, laughing inappropriately. He took medications of Lithium and Zyprexa. He is mostly in his room where he likely hallucinates but participates in programming.  Follow-up for Sunday the 29th. Patient seen chart reviewed. Patient seems to be much calmer than he was yesterday. He has been in bed much of the day and is not intrusive or up about the unit. He tells me that he is feeling tired but that he feels well now and wants to go home. 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 Still somewhat delusional but at least he was able to keep it together enough to make sense. Complains of sedation from the medicine but was easily aroused and does not appear to be delirious. Vitals stable.   Follow-up for the 30th. Patient has no new complaints. He still seems a little odd in conversation slow and distracted but not bizarre. Able to answer questions reasonably well. Denies suicidal thoughts denies hallucinations. No new complaints  08/03/2017. Darryl Hughes has made much improvement over the weekend. He is thinking is much more clear, mood better, and affect is brighter. He is participating in programming. He is visible in the day room. He even tries to play chess with a peer. He is cool and collected. He is no longer irritable or grandiose. There are no somatic complaints. He tolerates medications well. He appreciates progress he is making. His visitors is weekend who were also pleased. His lithium  level is 0.56. We will increase lithium dose.  Per nursing: D: Patient is pleasant with staff; he is well mannered.  His thought process is organized; he is future oriented.  He states, "when I am discharged I will go back to live with my mom and stepdad."  He states "things are good at home and I want to see my dog."  Patient had an EKG and it was placed on shadow chart.  He denies any  thoughts of self harm.  He continues to have some flat affect with stable mood.  He tends to isolate to his room, however, he does attend group.  He does not appear to be responding to internal stimuli. A: Continue to monitor medication management and MD orders.  Safety checks completed every 15 minutes per protocol.  Offer support and encouragement as needed. R: Patient is receptive to staff; his behavior is appropriate.    Principal Problem: Bipolar I disorder, most recent episode mixed, severe with psychotic features ([place]) Diagnosis:   Patient Active Problem List   Diagnosis Date Noted  . Bipolar I disorder, most recent episode mixed, severe with psychotic features ([place]) [F31.64] 07/29/2017  . 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 No pertinent past medical history. History reviewed. No pertinent surgical history. Family History: History reviewed. No pertinent family history.  Social History:  History  Alcohol use Not on file     History  Drug use: Unknown    Social History   Social History  . Marital status: Single    Spouse name: N/A  . Number of children: N/A  . Years of education: N/A   Social History Main Topics  . Smoking status: None  . Smokeless tobacco: None  . Alcohol use None  . Drug use: Unknown  . Sexual activity: Not Asked   Other Topics Concern  . None   Social History Narrative  . None   Additional Social History:                         Sleep: Fair  Appetite:  Fair  Current Medications: Current Facility-Administered Medications  Medication Dose Route Frequency Provider Last Rate Last Dose  . acetaminophen (TYLENOL) tablet 650 mg  650 mg Oral Q6H PRN Clapacs, John T, MD      . alum & mag hydroxide-simeth (MAALOX/MYLANTA) 200-200-20 MG/5ML suspension 30 mL  30 mL Oral Q4H PRN Clapacs, John T, MD      . lithium carbonate (LITHOBID) CR tablet 600  mg  600 mg Oral Q12H Pucilowska, Jolanta B, MD      . LORazepam (ATIVAN) tablet 2 mg  2 mg Oral Q4H PRN Clapacs, Madie Reno, MD   2 mg at 08/02/17 2110  . magnesium hydroxide (MILK OF MAGNESIA) suspension 30 mL  30 mL Oral Daily PRN Clapacs, John T, MD      . OLANZapine zydis (ZYPREXA) disintegrating tablet 10 mg  10 mg Oral BID AC & HS Pucilowska, Jolanta B, MD   10 mg at 08/03/17 0828  . temazepam (RESTORIL) capsule 15 mg  15 mg Oral QHS PRN Pucilowska, Jolanta B, MD        Lab Results:  Results for orders placed or performed during the hospital encounter of 07/29/17 (from the past 48 hour(s))  Lithium level     Status: Abnormal   Collection Time: 08/03/17  7:26 AM  Result Value Ref Range   Lithium Lvl 0.56 (L) 0.60 - 1.20 mmol/L    Blood Alcohol level:  Lab Results  Component Value Date   ETH <10 (H) 03/50/0938    Metabolic Disorder Labs: Lab Results  Component Value Date   HGBA1C 4.9 07/30/2017   MPG 93.93 07/30/2017   No results found for: PROLACTIN Lab Results  Component Value Date   CHOL 135 07/30/2017   TRIG 95 07/30/2017   HDL 32 (L) 07/30/2017   CHOLHDL 4.2 07/30/2017   VLDL 19 07/30/2017   LDLCALC 84 07/30/2017    Physical Findings: AIMS:  , ,  ,  ,    CIWA:    COWS:     Musculoskeletal: Strength & Muscle Tone: within normal limits Gait & Station: normal Patient leans: N/A  Psychiatric Specialty Exam: Physical Exam  Nursing note and vitals reviewed. Constitutional: He appears well-developed and well-nourished.  HENT:  Head: Normocephalic and atraumatic.  Eyes: Pupils are equal, round, and reactive to light. Conjunctivae are normal.  Neck: Normal range of motion.  Cardiovascular: Regular rhythm and normal heart sounds.   Respiratory: Effort normal. No respiratory distress.  GI: Soft.  Musculoskeletal: Normal range of motion.  Neurological: He is alert.  Skin: Skin is warm and dry.  Psychiatric: His behavior is normal. His affect is blunt. His affect  is not labile. His speech is not rapid and/or pressured. Thought content is not paranoid. Cognition and memory are normal. He does not express impulsivity. He expresses no suicidal ideation.    Review of Systems  Constitutional: Negative.   HENT: Negative.   Eyes: Negative.   Respiratory: Negative.   Cardiovascular: Negative.   Gastrointestinal: Negative.   Genitourinary: Negative.   Musculoskeletal: Negative.   Skin: Negative.   Neurological: Negative.   Endo/Heme/Allergies: Negative.   Psychiatric/Behavioral: Negative for hallucinations. The patient does not have insomnia.     Blood pressure 111/70, pulse 76, temperature 97.7 F (36.5 C), resp. rate 16, height _0  (1.727 m), weight 63.5 kg (140 lb), SpO2 100 %.Body mass index is 21.29 kg/m.  General Appearance: Casual  Eye Contact:  Good  Speech:  Pressured  Volume:  Normal  Mood:  Euphoric  Affect:  Inappropriate and Labile  Thought Process:  Goal Directed and Descriptions of Associations: Intact  Orientation:  Full (Time, Place, and Person)  Thought Content:  Hallucinations: Auditory  Suicidal Thoughts:  No  Homicidal Thoughts:  No  Memory:  Immediate;   Fair Recent;   Fair Remote;   Fair  Judgement:  Poor  Insight:  Lacking  Psychomotor Activity:  Increased and Restlessness  Concentration:  Concentration: Fair and Attention Span: Fair  Recall:  [employer] of Knowledge:  Fair  Language:  Fair  Akathisia:  No  Handed:  Right  AIMS (if indicated):     Assets:  Communication Skills Desire for Improvement Financial Resources/Insurance Housing Physical Health Resilience Social Support Transportation  ADL's:  Intact  Cognition:  WNL  Sleep:  Number of Hours: 8     Treatment Plan Summary: Daily contact with patient to assess and evaluate symptoms and progress in treatment and Medication management   Darryl Hughes is a 21 year old male with no past psychiatric history admitted floridly psychotic and  suicidal.  1. Suicidal ideation. The patient is able to contract for safety in the hospital.  2. Mood and psychosis. He was started on Zyprexa for psychosis and Lithium for mood stabilization. Lithium level 0.56. We will  increase to 600 mg bid.  3. Insomnia. Restoril is available.  4. Metabolic syndrome monitoring. Lipid panel, TSH, and hemoglobin A1c are normal.  5. EKG. Still pending.  6. Substance abuse. We do not have clear understanding of his use yet.   7. Disposition. He will be discharged to home with his family. He will follow up with a new provider.     Orson Slick, MD 08/03/2017, 4:38 PM

## 2017-08-03 NOTE — Progress Notes (Signed)
D: Patient is pleasant with staff; he is well mannered.  His thought process is organized; he is future oriented.  He states, "when I am discharged I will go back to live with my mom and stepdad."  He states "things are good at home and I want to see my dog."  Patient had an EKG and it was placed on shadow chart.  He denies any thoughts of self harm.  He continues to have some flat affect with stable mood.  He tends to isolate to his room, however, he does attend group.  He does not appear to be responding to internal stimuli. A: Continue to monitor medication management and MD orders.  Safety checks completed every 15 minutes per protocol.  Offer support and encouragement as needed. R: Patient is receptive to staff; his behavior is appropriate.

## 2017-08-03 NOTE — [facility] Group Notes (Signed)
[facility] Group Notes:  (Nursing/MHT/Case Management/Adjunct)  Date:  08/03/2017  Time:  10:36 AM  Type of Therapy:  Psychoeducational Skills  Participation Level:  Active  Participation Quality:  Appropriate  Affect:  Appropriate  Cognitive:  Appropriate  Insight:  Appropriate  Engagement in Group:  Engaged  Modes of Intervention:  Discussion, Education and Support  Summary of Progress/Problems:  Lynelle Smoke Marshel Golubski 08/03/2017, 10:36 AM

## 2017-08-04 MED ORDER — OLANZAPINE 5 MG PO TBDP
20.0000 mg | ORAL_TABLET | Freq: Every day | ORAL | Status: DC
  Administered 2017-08-04: 20 mg via ORAL
  Filled 2017-08-04: qty 4

## 2017-08-04 NOTE — Progress Notes (Signed)
[facility] MD Progress Note  08/04/2017 3:43 PM BEN HABERMANN  MRN:  [id number]  Subjective:   History of present illness. Information was obtained from the patient and the chart. The patient was brought to the emergency room floridly psychotic and unable to provide any information. The family noticed change in his behavior 6 weks ago and suspected drug use. He was admitted to psychiatry unit with a diagnosis of psychotic break. Today the patient is still disorganized but able to participate in the interview. She reports that for the past week or so he has been extremely depressed, lonely, and suicidal. He came to the hospital to "get away from his family". He is very adamant to tell me that he no longer feels depressed or suicidal. He is only depressed about the fact that he is in the hospital. He demands to be discharged as soon as possible. He quickly understood the rules of voluntary and involuntary admission and wants to sign 72 hour form. He reports that prior to depressive episode he felt extremely happy. He denies substance use understands that this is what his mother believes. He seems somewhat paranoid but denies auditory or visual hallucinations or religious preoccupations. He is hyper, argumentative, edgy, and very sharp. He denies any changes in his sleep and believes that he can sleep 8 hours if he chooses to. He denies changes in appetite. He denies any symptoms of anxiety.  Past psychiatric history. Apparently there is no history of mental illness or substance abuse. The patient makes a comment that he is problems occurred very suddenly causing academic problems. He denies ever attempting suicide. There were no hospitalizations.  Family psychiatric history. The patient denies any knowledge of history of mental illness, substance abuse, or suicide in the family.  Social history. He lives with his parents. He dropped out of [employer] freshman year and tried to go to [employer] with little success.    07/31/2017. Mr. Grose met with treatment team today. He is much more relaxed and childlike, laughing inappropriately. He took medications of Lithium and Zyprexa. He is mostly in his room where he likely hallucinates but participates in programming.  Follow-up for Sunday the 29th. Patient seen chart reviewed. Patient seems to be much calmer than he was yesterday. He has been in bed much of the day and is not intrusive or up about the unit. He tells me that he is feeling tired but that he feels well now and wants to go home. 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 Still somewhat delusional but at least he was able to keep it together enough to make sense. Complains of sedation from the medicine but was easily aroused and does not appear to be delirious. Vitals stable.   Follow-up for the 30th. Patient has no new complaints. He still seems a little odd in conversation slow and distracted but not bizarre. Able to answer questions reasonably well. Denies suicidal thoughts denies hallucinations. No new complaints  08/03/2017. Mr. Band has made much improvement over the weekend. He is thinking is much more clear, mood better, and affect is brighter. He is participating in programming. He is visible in the day room. He even tries to play chess with a peer. He is cool and collected. He is no longer irritable or grandiose. There are no somatic complaints. He tolerates medications well. He appreciates progress he is making. His visitors is weekend who were also pleased. His lithium  level is 0.56. We will increase lithium dose.  08/04/2017. Mr. Odor feels much better today since his discharge. He did not remember that we increased his lithium dose and we need to recheck the level. He still is disorganized and unable to locate for himself. His mother however feels that the patient is much much better and is ready to take home. There are no somatic complaints. He  participates in programming. Today he was again goofy and giddy laughing inappropriately.  there are no somatic complaints or side effects from medication.   Per nursing: D: Pt denies SI/HI/AVH, affect is flat but brightens upon approaching him. Pt is pleasant, polit  and cooperative with treatment plan, no bizarre behavior noted. Pt thoughts are organized no bizarre behavior noted, appears less anxious and he is interacting with peers and staff appropriately.  A: Pt was offered support and encouragement. Pt was given scheduled medications. Pt was encouraged to attend groups. Q 15 minute checks were done for safety.  R:Pt attends groups and interacts well with peers and staff. Pt is taking medication. Pt has no complaints.Pt receptive to treatment and safety maintained on unit.  Principal Problem: Bipolar I disorder, most recent episode mixed, severe with psychotic features ([place]) Diagnosis:   Patient Active Problem List   Diagnosis Date Noted  . Bipolar I disorder, most recent episode mixed, severe with psychotic features ([place]) [F31.64] 07/29/2017  . 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 No pertinent past medical history. History reviewed. No pertinent surgical history. Family History: History reviewed. No pertinent family history.  Social History:  History  Alcohol use Not on file     History  Drug use: Unknown    Social History   Social History  . Marital status: Single    Spouse name: N/A  . Number of children: N/A  . Years of education: N/A   Social History Main Topics  . Smoking status: None  . Smokeless tobacco: None  . Alcohol use None  . Drug use: Unknown  . Sexual activity: Not Asked   Other Topics Concern  . None   Social History Narrative  . None   Additional Social History:                         Sleep: Fair  Appetite:  Fair  Current  Medications: Current Facility-Administered Medications  Medication Dose Route Frequency Provider Last Rate Last Dose  . acetaminophen (TYLENOL) tablet 650 mg  650 mg Oral Q6H PRN Clapacs, John T, MD      . alum & mag hydroxide-simeth (MAALOX/MYLANTA) 200-200-20 MG/5ML suspension 30 mL  30 mL Oral Q4H PRN Clapacs, John T, MD      . lithium carbonate (LITHOBID) CR tablet 600 mg  600 mg Oral Q12H Shelsy Seng B, MD   600 mg at 08/04/17 0817  . LORazepam (ATIVAN) tablet 2 mg  2 mg Oral Q4H PRN Clapacs, Madie Reno, MD   2 mg at 08/02/17 2110  . magnesium hydroxide (MILK OF MAGNESIA) suspension 30 mL  30 mL Oral Daily PRN Clapacs, John T, MD      . OLANZapine zydis (ZYPREXA) disintegrating tablet 10 mg  10 mg Oral BID AC & HS Skai Lickteig B, MD   10 mg at 08/04/17 0817  . temazepam (RESTORIL) capsule 15 mg  15 mg Oral QHS PRN Nevena Rozenberg, Wardell Honour, MD  15 mg at 08/03/17 2209    Lab Results:  Results for orders placed or performed during the hospital encounter of 07/29/17 (from the past 48 hour(s))  Lithium level     Status: Abnormal   Collection Time: 08/03/17  7:26 AM  Result Value Ref Range   Lithium Lvl 0.56 (L) 0.60 - 1.20 mmol/L    Blood Alcohol level:  Lab Results  Component Value Date   ETH <10 (H) 92/09/9416    Metabolic Disorder Labs: Lab Results  Component Value Date   HGBA1C 4.9 07/30/2017   MPG 93.93 07/30/2017   No results found for: PROLACTIN Lab Results  Component Value Date   CHOL 135 07/30/2017   TRIG 95 07/30/2017   HDL 32 (L) 07/30/2017   CHOLHDL 4.2 07/30/2017   VLDL 19 07/30/2017   LDLCALC 84 07/30/2017    Physical Findings: AIMS:  , ,  ,  ,    CIWA:    COWS:     Musculoskeletal: Strength & Muscle Tone: within normal limits Gait & Station: normal Patient leans: N/A  Psychiatric Specialty Exam: Physical Exam  Nursing note and vitals reviewed. Constitutional: He appears well-developed and well-nourished.  HENT:  Head: Normocephalic and  atraumatic.  Eyes: Pupils are equal, round, and reactive to light. Conjunctivae are normal.  Neck: Normal range of motion.  Cardiovascular: Regular rhythm and normal heart sounds.   Respiratory: Effort normal. No respiratory distress.  GI: Soft.  Musculoskeletal: Normal range of motion.  Neurological: He is alert.  Skin: Skin is warm and dry.  Psychiatric: His behavior is normal. His affect is blunt. His affect is not labile. His speech is not rapid and/or pressured. Thought content is not paranoid. Cognition and memory are normal. He does not express impulsivity. He expresses no suicidal ideation.    Review of Systems  Constitutional: Negative.   HENT: Negative.   Eyes: Negative.   Respiratory: Negative.   Cardiovascular: Negative.   Gastrointestinal: Negative.   Genitourinary: Negative.   Musculoskeletal: Negative.   Skin: Negative.   Neurological: Negative.   Endo/Heme/Allergies: Negative.   Psychiatric/Behavioral: Negative for hallucinations. The patient does not have insomnia.     Blood pressure 129/75, pulse 80, temperature 97.9 F (36.6 C), temperature source Oral, resp. rate 18, height 5' 8"  (1.727 m), weight 63.5 kg (140 lb), SpO2 100 %.Body mass index is 21.29 kg/m.  General Appearance: Casual  Eye Contact:  Good  Speech:  Pressured  Volume:  Normal  Mood:  Euphoric  Affect:  Inappropriate and Labile  Thought Process:  Goal Directed and Descriptions of Associations: Intact  Orientation:  Full (Time, Place, and Person)  Thought Content:  Hallucinations: Auditory  Suicidal Thoughts:  No  Homicidal Thoughts:  No  Memory:  Immediate;   Fair Recent;   Fair Remote;   Fair  Judgement:  Poor  Insight:  Lacking  Psychomotor Activity:  Increased and Restlessness  Concentration:  Concentration: Fair and Attention Span: Fair  Recall:  [employer] of Knowledge:  Fair  Language:  Fair  Akathisia:  No  Handed:  Right  AIMS (if indicated):     Assets:  Communication  Skills Desire for Improvement Financial Resources/Insurance Housing Physical Health Resilience Social Support Transportation  ADL's:  Intact  Cognition:  WNL  Sleep:  Number of Hours: 8     Treatment Plan Summary: Daily contact with patient to assess and evaluate symptoms and progress in treatment and Medication management   Mr. Fowle is a  21 year old male with no past psychiatric history admitted floridly psychotic and suicidal.  1. Suicidal ideation. The patient is able to contract for safety in the hospital.  2. Mood and psychosis. He was started on Zyprexa for psychosis and Lithium for mood stabilization. Lithium level 0.56. We will increase to 600 mg bid. Lithium level tomorrow.  3. Insomnia. Restoril is available.  4. Metabolic syndrome monitoring. Lipid panel, TSH, and hemoglobin A1c are normal.  5. EKG. Normal sinus rhythm, QTc 404.  6. Substance abuse. We do not have clear understanding of his use yet.   7. Disposition. He will be discharged to home with his family. He will follow up with a new provider.     Orson Slick, MD 08/04/2017, 3:43 PM

## 2017-08-04 NOTE — Progress Notes (Signed)
D: Pt denies SI/HI/AVH, affect is flat but brightens upon approaching him. Pt is pleasant, polit  and cooperative with treatment plan, no bizarre behavior noted. Pt thoughts are organized no bizarre behavior noted, appears less anxious and he is interacting with peers and staff appropriately.  A: Pt was offered support and encouragement. Pt was given scheduled medications. Pt was encouraged to attend groups. Q 15 minute checks were done for safety.  R:Pt attends groups and interacts well with peers and staff. Pt is taking medication. Pt has no complaints.Pt receptive to treatment and safety maintained on unit.

## 2017-08-04 NOTE — Progress Notes (Signed)
Patient states this morning "I feel so happy because I am being loved."and he was laughing inappropriately.Denies SI,HI and AVH.Appropriate with staff & peers.Complinat with medications.Attended groups.Appetite and energy level good.Support and encouragement given.

## 2017-08-04 NOTE — Plan of Care (Signed)
Problem: Coping: Goal: Ability to cope will improve Outcome: Progressing Patient verbalized that he is not depressed anymore.  Problem: Safety: Goal: Ability to disclose and discuss suicidal ideas will improve Outcome: Progressing Safety maintained in the unit.  Problem: Education: Goal: Knowledge of disease or condition will improve Outcome: Progressing Patient is aware of the substance abuse complications.

## 2017-08-04 NOTE — [facility] Group Notes (Signed)
[facility] Group Notes:  (Nursing/MHT/Case Management/Adjunct)  Date:  08/04/2017  Time:  3:15 PM  Type of Therapy:  Psychoeducational Skills  Participation Level:  Active  Participation Quality:  Appropriate  Affect:  Appropriate  Cognitive:  Appropriate  Insight:  Appropriate  Engagement in Group:  Engaged  Modes of Intervention:  Discussion, Education, Socialization and Support  Summary of Progress/Problems:  Darryl Hughes 08/04/2017, 3:15 PM

## 2017-08-05 MED ORDER — TRAZODONE HCL 100 MG PO TABS: 100.0000 mg | ORAL_TABLET | Freq: Every day | ORAL | Status: DC

## 2017-08-05 MED ORDER — TRAZODONE HCL 100 MG PO TABS: 100.0000 mg | ORAL_TABLET | Freq: Every evening | ORAL | Status: DC | PRN

## 2017-08-05 MED ORDER — LITHIUM CARBONATE ER 300 MG PO TBCR: 600.0000 mg | EXTENDED_RELEASE_TABLET | Freq: Two times a day (BID) | ORAL | 1 refills | Status: DC

## 2017-08-05 MED ORDER — OLANZAPINE 10 MG PO TABS
30.0000 mg | ORAL_TABLET | Freq: Every day | ORAL | Status: DC
  Administered 2017-08-05: 30 mg via ORAL
  Filled 2017-08-05: qty 3

## 2017-08-05 MED ORDER — TRAZODONE HCL 100 MG PO TABS: 100.0000 mg | ORAL_TABLET | Freq: Every evening | ORAL | 1 refills | Status: DC | PRN

## 2017-08-05 MED ORDER — OLANZAPINE 15 MG PO TABS: 30.0000 mg | ORAL_TABLET | Freq: Every day | ORAL | 1 refills | Status: DC

## 2017-08-05 NOTE — Progress Notes (Signed)
Pt is very pleasant and anxious. He says he is excited about being discharged tomorrow. Pt's affect is brighter. Pt has minimal interaction with peers. Denies depression, SI, HI or A/V hallucinations.  Pt is med compliant, no adverse affects noted. Pt is cooperative and appropriate. Pt did attend wrapup group. Pt contract to safety. 15 min safety checks continues.

## 2017-08-05 NOTE — Progress Notes (Signed)
D: Pt denies SI/HI/AVH, affect is flat but brightens upon approaching him. Pt is pleasant, polit  and cooperative with treatment plan.  Pt thoughts are organized no bizarre behavior noted, appears less anxious and he is interacting with peers and staff appropriately.  A: Pt was offered support and encouragement. Pt was given scheduled medications. Pt was encouraged to attend groups. Q 15 minute checks were done for safety.  R:Pt attends groups and interacts well with peers and staff. Pt is taking medication. Pt has no complaints.Pt receptive to treatment and safety maintained on unit.

## 2017-08-05 NOTE — Tx Team (Signed)
Interdisciplinary Treatment and Diagnostic Plan Update  08/05/2017 Time of Session: 10:30am Darryl Hughes MRN: [id number]  Principal Diagnosis: Bipolar I disorder, most recent episode mixed, severe with psychotic features (HCC)  Secondary Diagnoses: Principal Problem:   Bipolar I disorder, most recent episode mixed, severe with psychotic features (HCC)   Current Medications:  Current Facility-Administered Medications  Medication Dose Route Frequency Provider Last Rate Last Dose  . acetaminophen (TYLENOL) tablet 650 mg  650 mg Oral Q6H PRN Clapacs, John T, MD      . alum & mag hydroxide-simeth (MAALOX/MYLANTA) 200-200-20 MG/5ML suspension 30 mL  30 mL Oral Q4H PRN Clapacs, John T, MD      . lithium carbonate (LITHOBID) CR tablet 600 mg  600 mg Oral Q12H Pucilowska, Jolanta B, MD   600 mg at 08/05/17 0831  . magnesium hydroxide (MILK OF MAGNESIA) suspension 30 mL  30 mL Oral Daily PRN Clapacs, John T, MD      . OLANZapine (ZYPREXA) tablet 30 mg  30 mg Oral QHS Pucilowska, Jolanta B, MD      . traZODone (DESYREL) tablet 100 mg  100 mg Oral QHS PRN Pucilowska, Jolanta B, MD       PTA Medications: No prescriptions prior to admission.   Patient Stressors: Financial difficulties Medication change or noncompliance Substance abuse  Patient Strengths: Average or above average intelligence Supportive family/friends  Treatment Modalities: Medication Management, Group therapy, Case management,  1 to 1 session with clinician, Psychoeducation, Recreational therapy.   Physician Treatment Plan for Primary Diagnosis: Bipolar I disorder, most recent episode mixed, severe with psychotic features (HCC) Long Term Goal(s): Improvement in symptoms so as ready for discharge Improvement in symptoms so as ready for discharge   Short Term Goals: Ability to identify changes in lifestyle to reduce recurrence of condition will improve Ability to verbalize feelings will improve Ability to disclose  and discuss suicidal ideas Ability to demonstrate self-control will improve Ability to identify and develop effective coping behaviors will improve Ability to maintain clinical measurements within normal limits will improve Compliance with prescribed medications will improve Ability to identify triggers associated with substance abuse/mental health issues will improve Ability to identify changes in lifestyle to reduce recurrence of condition will improve Ability to demonstrate self-control will improve Ability to identify triggers associated with substance abuse/mental health issues will improve  Medication Management: Evaluate patient's response, side effects, and tolerance of medication regimen.  Therapeutic Interventions: 1 to 1 sessions, Unit Group sessions and Medication administration.  Evaluation of Outcomes: Not Progressing  Physician Treatment Plan for Secondary Diagnosis: Principal Problem:   Bipolar I disorder, most recent episode mixed, severe with psychotic features (HCC)  Long Term Goal(s): Improvement in symptoms so as ready for discharge Improvement in symptoms so as ready for discharge   Short Term Goals: Ability to identify changes in lifestyle to reduce recurrence of condition will improve Ability to verbalize feelings will improve Ability to disclose and discuss suicidal ideas Ability to demonstrate self-control will improve Ability to identify and develop effective coping behaviors will improve Ability to maintain clinical measurements within normal limits will improve Compliance with prescribed medications will improve Ability to identify triggers associated with substance abuse/mental health issues will improve Ability to identify changes in lifestyle to reduce recurrence of condition will improve Ability to demonstrate self-control will improve Ability to identify triggers associated with substance abuse/mental health issues will improve     Medication  Management: Evaluate patient's response, side effects, and tolerance of medication regimen.  Therapeutic Interventions: 1 to 1 sessions, Unit Group sessions and Medication administration.  Evaluation of Outcomes: Not Progressing   RN Treatment Plan for Primary Diagnosis: Bipolar I disorder, most recent episode mixed, severe with psychotic features (HCC) Long Term Goal(s): Knowledge of disease and therapeutic regimen to maintain health will improve  Short Term Goals: Ability to identify and develop effective coping behaviors will improve and Compliance with prescribed medications will improve  Medication Management: RN will administer medications as ordered by provider, will assess and evaluate patient's response and provide education to patient for prescribed medication. RN will report any adverse and/or side effects to prescribing provider.  Therapeutic Interventions: 1 on 1 counseling sessions, Psychoeducation, Medication administration, Evaluate responses to treatment, Monitor vital signs and CBGs as ordered, Perform/monitor CIWA, COWS, AIMS and Fall Risk screenings as ordered, Perform wound care treatments as ordered.  Evaluation of Outcomes: Not Progressing   LCSW Treatment Plan for Primary Diagnosis: Bipolar I disorder, most recent episode mixed, severe with psychotic features (HCC) Long Term Goal(s): Safe transition to appropriate next level of care at discharge, Engage patient in therapeutic group addressing interpersonal concerns.  Short Term Goals: Engage patient in aftercare planning with referrals and resources and Increase skills for wellness and recovery  Therapeutic Interventions: Assess for all discharge needs, 1 to 1 time with Social worker, Explore available resources and support systems, Assess for adequacy in community support network, Educate family and significant other(s) on suicide prevention, Complete Psychosocial Assessment, Interpersonal group  therapy.  Evaluation of Outcomes: Not Progressing   Progress in Treatment: Attending groups: Yes. Participating in groups: Yes. Taking medication as prescribed: Yes. Toleration medication: Yes. Family/Significant other contact made: Yes, individual(s) contacted:  mother Patient understands diagnosis: No. Discussing patient identified problems/goals with staff: Yes. Medical problems stabilized or resolved: Yes. Denies suicidal/homicidal ideation: Yes. Issues/concerns per patient self-inventory: Yes. Other: none  New problem(s) identified: No, Describe:  none  New Short Term/Long Term Goal(s):Pt goal: "get better."  Discharge Plan or Barriers: Pt will be referred to [facility] and return home with family  Reason for Continuation of Hospitalization: Delusions  Medication stabilization  Estimated Length of Stay: 1 days.  Attendees: Patient: 08/05/2017 5:39 PM  Physician:  08/05/2017 5:39 PM  Nursing:  08/05/2017 5:39 PM  RN Care Manager: 08/05/2017 5:39 PM  Social Worker:  08/05/2017 5:39 PM  Recreational Therapist:  08/05/2017 5:39 PM  Other:  08/05/2017 5:39 PM  Other:  08/05/2017 5:39 PM  Other: 08/05/2017 5:39 PM    Scribe for Treatment Team: Glennon Mac, LCSW 08/05/2017 5:39 PM

## 2017-08-05 NOTE — Plan of Care (Signed)
Problem: Safety: Goal: Ability to remain free from injury will improve Outcome: Progressing Patient remains free from injury at this time.

## 2017-08-05 NOTE — Progress Notes (Signed)
Patient alert and oriented to self. Ambulatory, up ad lib with a steady gait. Present in the community, observed interacting with select peers. Appearance is neat and clean, attends meetings. Thought process are more organized at this time. Adequate po intake. Milieu remains safe at this time.

## 2017-08-05 NOTE — Progress Notes (Signed)
  [facility] Adult Case Management Discharge Plan :  Will you be returning to the same living situation after discharge:  Yes,    At discharge, do you have transportation home?: Yes,    Do you have the ability to pay for your medications: Yes,     Release of information consent forms completed and in the chart;  Patient's signature needed at discharge.  Patient to Follow up at: Follow-up Information    [facility] Go on 08/10/2017.   Why:  Please meet Unk Pinto, Peer Support Services, for your intake appointment on Monday, 08/10/17, at 7:15am.  Please bring a copy of your hospital discharge paperwork. Contact information: [address] [place] [address] [phone]           Next level of care provider has access to [facility] Link:no  Safety Planning and Suicide Prevention discussed: Yes,     Have you used any form of tobacco in the last 30 days? (Cigarettes, Smokeless Tobacco, Cigars, and/or Pipes): No  Has patient been referred to the Quitline?: N/A patient is not a smoker  Patient has been referred for addiction treatment: Yes  Glennon Mac, LCSW 08/05/2017, 5:11 PM

## 2017-08-05 NOTE — [facility] Group Notes (Signed)
[facility] Group Notes:  (Nursing/MHT/Case Management/Adjunct)  Date:  08/05/2017  Time:  5:00 PM  Type of Therapy:  Psychoeducational Skills  Participation Level:  Active  Participation Quality:  Appropriate  Affect:  Appropriate  Cognitive:  Appropriate  Insight:  Appropriate  Engagement in Group:  Engaged  Modes of Intervention:  Discussion, Education, Socialization and Support  Summary of Progress/Problems:  Lynelle Smoke Tareva Leske 08/05/2017, 5:00 PM

## 2017-08-05 NOTE — Progress Notes (Signed)
[facility] MD Progress Note  08/05/2017 2:52 PM Darryl Hughes  MRN:  [id number]  Subjective:   History of present illness. Information was obtained from the patient and the chart. The patient was brought to the emergency room floridly psychotic and unable to provide any information. The family noticed change in his behavior 6 weks ago and suspected drug use. He was admitted to psychiatry unit with a diagnosis of psychotic break. Today the patient is still disorganized but able to participate in the interview. She reports that for the past week or so he has been extremely depressed, lonely, and suicidal. He came to the hospital to "get away from his family". He is very adamant to tell me that he no longer feels depressed or suicidal. He is only depressed about the fact that he is in the hospital. He demands to be discharged as soon as possible. He quickly understood the rules of voluntary and involuntary admission and wants to sign 72 hour form. He reports that prior to depressive episode he felt extremely happy. He denies substance use understands that this is what his mother believes. He seems somewhat paranoid but denies auditory or visual hallucinations or religious preoccupations. He is hyper, argumentative, edgy, and very sharp. He denies any changes in his sleep and believes that he can sleep 8 hours if he chooses to. He denies changes in appetite. He denies any symptoms of anxiety.  Past psychiatric history. Apparently there is no history of mental illness or substance abuse. The patient makes a comment that he is problems occurred very suddenly causing academic problems. He denies ever attempting suicide. There were no hospitalizations.  Family psychiatric history. The patient denies any knowledge of history of mental illness, substance abuse, or suicide in the family.  Social history. He lives with his parents. He dropped out of [employer] freshman year and tried to go to [employer] with little success.    07/31/2017. Darryl Hughes met with treatment team today. He is much more relaxed and childlike, laughing inappropriately. He took medications of Lithium and Zyprexa. He is mostly in his room where he likely hallucinates but participates in programming.  Follow-up for Sunday the 29th. Patient seen chart reviewed. Patient seems to be much calmer than he was yesterday. He has been in bed much of the day and is not intrusive or up about the unit. He tells me that he is feeling tired but that he feels well now and wants to go home. 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 Still somewhat delusional but at least he was able to keep it together enough to make sense. Complains of sedation from the medicine but was easily aroused and does not appear to be delirious. Vitals stable.   Follow-up for the 30th. Patient has no new complaints. He still seems a little odd in conversation slow and distracted but not bizarre. Able to answer questions reasonably well. Denies suicidal thoughts denies hallucinations. No new complaints  08/03/2017. Darryl Hughes has made much improvement over the weekend. He is thinking is much more clear, mood better, and affect is brighter. He is participating in programming. He is visible in the day room. He even tries to play chess with a peer. He is cool and collected. He is no longer irritable or grandiose. There are no somatic complaints. He tolerates medications well. He appreciates progress he is making. His visitors is weekend who were also pleased. His lithium  level is 0.56. We will increase lithium dose.  08/04/2017. Darryl Hughes feels much better today since his discharge. He did not remember that we increased his lithium dose and we need to recheck the level. He still is disorganized and unable to locate for himself. His mother however feels that the patient is much much better and is ready to take home. There are no somatic complaints. He  participates in programming. Today he was again goofy and giddy laughing inappropriately.  there are no somatic complaints or side effects from medication.   08/05/2017. Darryl Hughes feels much better and is ready for discharge. He is still slightly disorganized and suspicious or unclear about his medications. His mother is very happy with his prograss. We will check Lithium level tomorrow and discharge him with family. He is not suicidal or homicidal. There are no somatic complaints.  Per nursing: Patient states this morning "I feel so happy because I am being loved."and he was laughing inappropriately.Denies SI,HI and AVH.Appropriate with staff & peers.Complinat with medications.Attended groups.Appetite and energy level good.Support and encouragement given.  Principal Problem: Bipolar I disorder, most recent episode mixed, severe with psychotic features ([place]) Diagnosis:   Patient Active Problem List   Diagnosis Date Noted  . Bipolar I disorder, most recent episode mixed, severe with psychotic features ([place]) [F31.64] 07/29/2017  . 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 No pertinent past medical history. History reviewed. No pertinent surgical history. Family History: History reviewed. No pertinent family history.  Social History:  History  Alcohol use Not on file     History  Drug use: Unknown    Social History   Social History  . Marital status: Single    Spouse name: N/A  . Number of children: N/A  . Years of education: N/A   Social History Main Topics  . Smoking status: None  . Smokeless tobacco: None  . Alcohol use None  . Drug use: Unknown  . Sexual activity: Not Asked   Other Topics Concern  . None   Social History Narrative  . None   Additional Social History:                         Sleep: Fair  Appetite:  Fair  Current Medications: Current  Facility-Administered Medications  Medication Dose Route Frequency Provider Last Rate Last Dose  . acetaminophen (TYLENOL) tablet 650 mg  650 mg Oral Q6H PRN Clapacs, John T, MD      . alum & mag hydroxide-simeth (MAALOX/MYLANTA) 200-200-20 MG/5ML suspension 30 mL  30 mL Oral Q4H PRN Clapacs, John T, MD      . lithium carbonate (LITHOBID) CR tablet 600 mg  600 mg Oral Q12H Mikaelah Trostle B, MD   600 mg at 08/05/17 0831  . LORazepam (ATIVAN) tablet 2 mg  2 mg Oral Q4H PRN Clapacs, Madie Reno, MD   2 mg at 08/04/17 2037  . magnesium hydroxide (MILK OF MAGNESIA) suspension 30 mL  30 mL Oral Daily PRN Clapacs, John T, MD      . OLANZapine zydis (ZYPREXA) disintegrating tablet 20 mg  20 mg Oral QHS Lattie Riege B, MD   20 mg at 08/04/17 2220  . temazepam (RESTORIL) capsule 15 mg  15 mg Oral QHS PRN Othel Hoogendoorn B, MD   15 mg at 08/04/17 2220    Lab Results:  No results found for  this or any previous visit (from the past 48 hour(s)).  Blood Alcohol level:  Lab Results  Component Value Date   ETH <10 (H) 19/14/7829    Metabolic Disorder Labs: Lab Results  Component Value Date   HGBA1C 4.9 07/30/2017   MPG 93.93 07/30/2017   No results found for: PROLACTIN Lab Results  Component Value Date   CHOL 135 07/30/2017   TRIG 95 07/30/2017   HDL 32 (L) 07/30/2017   CHOLHDL 4.2 07/30/2017   VLDL 19 07/30/2017   LDLCALC 84 07/30/2017    Physical Findings: AIMS:  , ,  ,  ,    CIWA:    COWS:     Musculoskeletal: Strength & Muscle Tone: within normal limits Gait & Station: normal Patient leans: N/A  Psychiatric Specialty Exam: Physical Exam  Nursing note and vitals reviewed. Constitutional: He appears well-developed and well-nourished.  HENT:  Head: Normocephalic and atraumatic.  Eyes: Pupils are equal, round, and reactive to light. Conjunctivae are normal.  Neck: Normal range of motion.  Cardiovascular: Regular rhythm and normal heart sounds.   Respiratory: Effort  normal. No respiratory distress.  GI: Soft.  Musculoskeletal: Normal range of motion.  Neurological: He is alert.  Skin: Skin is warm and dry.  Psychiatric: He has a normal mood and affect. His speech is normal and behavior is normal. Thought content is delusional. Cognition and memory are normal. He expresses impulsivity.    Review of Systems  Constitutional: Negative.   HENT: Negative.   Eyes: Negative.   Respiratory: Negative.   Cardiovascular: Negative.   Gastrointestinal: Negative.   Genitourinary: Negative.   Musculoskeletal: Negative.   Skin: Negative.   Neurological: Negative.   Endo/Heme/Allergies: Negative.   Psychiatric/Behavioral: Negative for hallucinations. The patient does not have insomnia.     Blood pressure 109/72, pulse 61, temperature 98.7 F (37.1 C), temperature source Oral, resp. rate 18, height _0  (1.727 m), weight 63.5 kg (140 lb), SpO2 100 %.Body mass index is 21.29 kg/m.  General Appearance: Casual  Eye Contact:  Good  Speech:  Pressured  Volume:  Normal  Mood:  Euphoric  Affect:  Inappropriate and Labile  Thought Process:  Goal Directed and Descriptions of Associations: Intact  Orientation:  Full (Time, Place, and Person)  Thought Content:  Hallucinations: Auditory  Suicidal Thoughts:  No  Homicidal Thoughts:  No  Memory:  Immediate;   Fair Recent;   Fair Remote;   Fair  Judgement:  Poor  Insight:  Lacking  Psychomotor Activity:  Increased and Restlessness  Concentration:  Concentration: Fair and Attention Span: Fair  Recall:  [employer] of Knowledge:  Fair  Language:  Fair  Akathisia:  No  Handed:  Right  AIMS (if indicated):     Assets:  Communication Skills Desire for Improvement Financial Resources/Insurance Housing Physical Health Resilience Social Support Transportation  ADL's:  Intact  Cognition:  WNL  Sleep:  Number of Hours: 7.75     Treatment Plan Summary: Daily contact with patient to assess and evaluate symptoms  and progress in treatment and Medication management   Darryl Hughes is a 21 year old male with no past psychiatric history admitted floridly psychotic and suicidal.  1. Suicidal ideation. Resolved. The patient is able to contract for safety in the hospital.  2. Mood and psychosis. He was started on Zyprexa for psychosis and Lithium for mood stabilization. Lithium level 0.56. We will increase to 600 mg bid. Lithium level tomorrow.  3. Insomnia. Restoril is available.  4. Metabolic syndrome monitoring. Lipid panel, TSH, and hemoglobin A1c are normal.  5. EKG. Normal sinus rhythm, QTc 404.  6. Substance abuse. The patient denies any substance abuse.    7. Disposition. He will be discharged to home with his family. He will follow up with [facility].     Orson Slick, MD 08/05/2017, 2:52 PM

## 2017-08-06 DIAGNOSIS — F3164 Bipolar disorder, current episode mixed, severe, with psychotic features: Principal | ICD-10-CM

## 2017-08-06 DIAGNOSIS — F1690 Hallucinogen use, unspecified, uncomplicated: Secondary | ICD-10-CM | POA: Diagnosis present

## 2017-08-06 LAB — LITHIUM LEVEL: LITHIUM LVL: 0.71 mmol/L (ref 0.60–1.20)

## 2017-08-06 NOTE — Discharge Summary (Deleted)
Physician Discharge Summary Note  Patient:  Darryl Hughes is an 21 y.o., male MRN:  [id number] DOB:  [birth year] Patient phone:  [phone] (home)  Patient address:   [address] [place] [address],  Total Time spent with patient: 30 minutes  Date of Admission:  07/29/2017 Date of Discharge: 08/06/2017  Reason for Admission:  Psychotic break.  Identifying data. Darryl Hughes is a 21 year old male with first episode psychosis.  Chief complaint. "I was suicidal but not any longer."  History of present illness. Information was obtained from the patient and the chart. The patient was brought to the emergency room floridly psychotic and unable to provide any information. His family. The patient was using. He was admitted to psychiatry unit with a diagnosis of psychotic break. Today the patient is still disorganized but able to participate in the interview. She reports that for the past week or so he has been extremely depressed, lonely, and suicidal. He came to the hospital to "get away from his family". He is very adamant to tell me that he no longer feels depressed or suicidal. He is only depressed about the fact that he is in the hospital. He demands to be discharged as soon as possible. He quickly understood the rules of voluntary and involuntary admission and wants to sign 72 hour form. He reports that prior to depressive episode he felt extremely happy. He denies substance use understands that this is what his mother believes. He seems somewhat paranoid but denies auditory or visual hallucinations or religious preoccupations. He is hyper, argumentative, edgy, and very sharp. He denies any changes in his sleep and believes that he can sleep 8 hours if he chooses to. He denies changes in appetite. He denies any symptoms of anxiety.  Past psychiatric history. Apparently there is no history of mental illness or substance abuse. The patient makes a comment that he is problems occurred  very suddenly causing academic problems. He denies ever attempting suicide. There were no hospitalizations.  Family psychiatric history. The patient denies any knowledge of history of mental illness, substance abuse, or suicide in the family.  Social history. He lives with his parents. He does not work. He went to [employer] but dropped out. He tried [employer] but did not do well.    Principal Problem: Bipolar I disorder, most recent episode mixed, severe with psychotic features [facility]) Discharge Diagnoses: Patient Active Problem List   Diagnosis Date Noted  . Bipolar I disorder, most recent episode mixed, severe with psychotic features (HCC) [F31.64] 07/29/2017  . Hallucinogen abuse [facility]) [F16.10] 07/28/2017   Past Medical History: History reviewed. No pertinent past medical history. History reviewed. No pertinent surgical history. Family History: History reviewed. No pertinent family history.  Social History:  History  Alcohol use Not on file     History  Drug use: Unknown    Social History   Social History  . Marital status: Single    Spouse name: N/A  . Number of children: N/A  . Years of education: N/A   Social History Main Topics  . Smoking status: None  . Smokeless tobacco: None  . Alcohol use None  . Drug use: Unknown  . Sexual activity: Not Asked   Other Topics Concern  . None   Social History Narrative  . None    Hospital Course:    Darryl Hughes is a 21 year old male with no past psychiatric history admitted floridly psychotic and suicidal.  1.Suicidal ideation. Resolved. The patient  is able to contract for safety. He is forward thinking and optimistic about the future. He is a loving son.   2. Mood and psychosis. He was started on Zyprexa 30 mg for psychosis and Lithium 600 mg twice daily for mood stabilization. Lithium level 0.71.    3. Insomnia. Trazodone was available.  4. Metabolic syndrome monitoring. Lipid panel, TSH, and hemoglobin A1c are normal.  5.  EKG. Normal sinus rhythm, QTc 404.  6. Substance abuse. The patient denies any substance abuse.    7. Disposition. He was discharged to home with his family. He will follow up with [facility].   Physical Findings: AIMS:  , ,  ,  ,    CIWA:    COWS:     Musculoskeletal: Strength & Muscle Tone: within normal limits Gait & Station: normal Patient leans: N/A  Psychiatric Specialty Exam: Physical Exam  Nursing note and vitals reviewed. Psychiatric: He has a normal mood and affect. His speech is normal and behavior is normal. Thought content normal. Cognition and memory are normal. He expresses impulsivity.    Review of Systems  Constitutional: Negative.   HENT: Negative.   Eyes: Negative.   Respiratory: Negative.   Cardiovascular: Negative.   Gastrointestinal: Negative.   Genitourinary: Negative.   Musculoskeletal: Negative.   Skin: Negative.   Neurological: Negative.   Endo/Heme/Allergies: Negative.   Psychiatric/Behavioral: Negative.     Blood pressure 117/80, pulse (!) 57, temperature 97.8 F (36.6 C), resp. rate 18, height  (1.727 m), weight 63.5 kg (140 lb), SpO2 100 %.Body mass index is 21.29 kg/m.  General Appearance: Casual  Eye Contact:  Good  Speech:  Clear and Coherent  Volume:  Normal  Mood:  Euthymic  Affect:  Appropriate  Thought Process:  Goal Directed and Descriptions of Associations: Intact  Orientation:  Full (Time, Place, and Person)  Thought Content:  WDL  Suicidal Thoughts:  No  Homicidal Thoughts:  No  Memory:  Immediate;   Fair Recent;   Fair Remote;   Fair  Judgement:  Impaired  Insight:  Present  Psychomotor Activity:  Normal  Concentration:  Concentration: Fair and Attention Span: Fair  Recall:  [employer] of Knowledge:  Fair  Language:  Fair  Akathisia:  No  Handed:  Right  AIMS (if indicated):     Assets:  Communication Skills Desire for Improvement Housing Physical Health Resilience Social Support  ADL's:  Intact  Cognition:   WNL  Sleep:  Number of Hours: 7.45     Have you used any form of tobacco in the last 30 days? (Cigarettes, Smokeless Tobacco, Cigars, and/or Pipes): No  Has this patient used any form of tobacco in the last 30 days? (Cigarettes, Smokeless Tobacco, Cigars, and/or Pipes) Yes, No  Blood Alcohol level:  Lab Results  Component Value Date   ETH <10 (H) 07/28/2017    Metabolic Disorder Labs:  Lab Results  Component Value Date   HGBA1C 4.9 07/30/2017   MPG 93.93 07/30/2017   No results found for: PROLACTIN Lab Results  Component Value Date   CHOL 135 07/30/2017   TRIG 95 07/30/2017   HDL 32 (L) 07/30/2017   CHOLHDL 4.2 07/30/2017   VLDL 19 07/30/2017   LDLCALC 84 07/30/2017    See Psychiatric Specialty Exam and Suicide Risk Assessment completed by Attending Physician prior to discharge.  Discharge destination:  Home  Is patient on multiple antipsychotic therapies at discharge:  No   Has Patient had three or  more failed trials of antipsychotic monotherapy by history:  No  Recommended Plan for Multiple Antipsychotic Therapies: NA  Discharge Instructions    Diet - low sodium heart healthy    Complete by:  As directed    Increase activity slowly    Complete by:  As directed      Allergies as of 08/06/2017   Not on File     Medication List    TAKE these medications     Indication  lithium carbonate 300 MG CR tablet Commonly known as:  LITHOBID Take 2 tablets (600 mg total) by mouth every 12 (twelve) hours.  Indication:  Manic-Depression   OLANZapine 15 MG tablet Commonly known as:  ZYPREXA Take 2 tablets (30 mg total) by mouth at bedtime.  Indication:  Manic-Depression   traZODone 100 MG tablet Commonly known as:  DESYREL Take 1 tablet (100 mg total) by mouth at bedtime as needed for sleep.  Indication:  Trouble Sleeping      Follow-up Information    [facility] Go on 08/10/2017.   Why:  Please meet Unk Pinto, [facility], for  your intake appointment on Monday, 08/10/17, at 7:15am.  Please bring a copy of your hospital discharge paperwork. Contact information: [address] [place] [address] [phone]           Follow-up recommendations:  Activity:  as tolerated. Diet:  regular. Other:  keep follow up appointments.  Comments:    Signed: Kristine Linea, MD 08/06/2017, 7:44 AM

## 2017-08-06 NOTE — Discharge Summary (Addendum)
Physician Discharge Summary Note  Patient:  Darryl Hughes is an 21 y.o., male MRN:  [id number] DOB:  November 30, 1995 Patient phone:  [phone] (home)  Patient address:   [address] [place] [address],  Total Time spent with patient: 30 minutes  Date of Admission:  07/29/2017 Date of Discharge: 08/06/2017  Reason for Admission:  Psychotic break.  Identifying data. Darryl Hughes is a 21 year old male with first episode psychosis.  Chief complaint. "I was suicidal but not any longer."  History of present illness. Information was obtained from the patient and the chart. The patient was brought to the emergency room floridly psychotic and unable to provide any information. His family. The patient was using. He was admitted to psychiatry unit with a diagnosis of psychotic break. Today the patient is still disorganized but able to participate in the interview. She reports that for the past week or so he has been extremely depressed, lonely, and suicidal. He came to the hospital to "get away from his family". He is very adamant to tell me that he no longer feels depressed or suicidal. He is only depressed about the fact that he is in the hospital. He demands to be discharged as soon as possible. He quickly understood the rules of voluntary and involuntary admission and wants to sign 72 hour form. He reports that prior to depressive episode he felt extremely happy. He denies substance use understands that this is what his mother believes. He seems somewhat paranoid but denies auditory or visual hallucinations or religious preoccupations. He is hyper, argumentative, edgy, and very sharp. He denies any changes in his sleep and believes that he can sleep 8 hours if he chooses to. He denies changes in appetite. He denies any symptoms of anxiety.  Past psychiatric history. Apparently there is no history of mental illness or substance abuse. The patient makes a comment that he is problems occurred  very suddenly causing academic problems. He denies ever attempting suicide. There were no hospitalizations.  Family psychiatric history. The patient denies any knowledge of history of mental illness, substance abuse, or suicide in the family.  Social history. He lives with his parents. He does not work. He went to [employer] but dropped out. He tried [employer] but did not do well.    Principal Problem: Bipolar I disorder, most recent episode mixed, severe with psychotic features [facility]) Discharge Diagnoses: Patient Active Problem List   Diagnosis Date Noted  . 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 Bipolar I disorder, most recent episode mixed, severe with psychotic features (HCC) [F31.64] 07/29/2017  . Cannabis use disorder, mild, abuse [F12.10] 07/28/2017   Past Medical History: History reviewed. No pertinent past medical history. History reviewed. No pertinent surgical history. Family History: History reviewed. No pertinent family history.  Social History:  History  Alcohol use Not on file     History  Drug use: Unknown    Social History   Social History  . Marital status: Single    Spouse name: N/A  . Number of children: N/A  . Years of education: N/A   Social History Main Topics  . Smoking status: None  . Smokeless tobacco: None  . Alcohol use None  . Drug use: Unknown  . Sexual activity: Not Asked   Other Topics Concern  . None   Social History Narrative  . None    Hospital Course:    Darryl Hughes is a 21 year old male with no past psychiatric history admitted floridly psychotic  and suicidal.  1.Suicidal ideation. Resolved. The patient is able to contract for safety. He is forward thinking and optimistic about the future. He is a loving son.   2. Mood and psychosis. He was started on Zyprexa 30 mg for psychosis and Lithium 600 mg twice daily for mood stabilization. Lithium level 0.71.    3. Insomnia. Trazodone was available.  4. Metabolic syndrome monitoring.  Lipid panel, TSH, and hemoglobin A1c are normal.  5. EKG. Normal sinus rhythm, QTc 404.  6. Substance abuse. The patient reported using cannabis and acid. He is not a heavy drinker. He is interested in [facility] IOP participation.     7. Disposition. He was discharged to home with his family. He will follow up with [facility] for medication management and substance abuse treatment.   Physical Findings: AIMS:  , ,  ,  ,    CIWA:    COWS:     Musculoskeletal: Strength & Muscle Tone: within normal limits Gait & Station: normal Patient leans: N/A  Psychiatric Specialty Exam: Physical Exam  Nursing note and vitals reviewed. Psychiatric: He has a normal mood and affect. His speech is normal and behavior is normal. Thought content normal. Cognition and memory are normal. He expresses impulsivity.    Review of Systems  Constitutional: Negative.   HENT: Negative.   Eyes: Negative.   Respiratory: Negative.   Cardiovascular: Negative.   Gastrointestinal: Negative.   Genitourinary: Negative.   Musculoskeletal: Negative.   Skin: Negative.   Neurological: Negative.   Endo/Heme/Allergies: Negative.   Psychiatric/Behavioral: Negative.     Blood pressure 117/80, pulse (!) 57, temperature 97.8 F (36.6 C), resp. rate 18, height  (1.727 m), weight 63.5 kg (140 lb), SpO2 100 %.Body mass index is 21.29 kg/m.  General Appearance: Casual  Eye Contact:  Good  Speech:  Clear and Coherent  Volume:  Normal  Mood:  Euthymic  Affect:  Appropriate  Thought Process:  Goal Directed and Descriptions of Associations: Intact  Orientation:  Full (Time, Place, and Person)  Thought Content:  WDL  Suicidal Thoughts:  No  Homicidal Thoughts:  No  Memory:  Immediate;   Fair Recent;   Fair Remote;   Fair  Judgement:  Impaired  Insight:  Present  Psychomotor Activity:  Normal  Concentration:  Concentration: Fair and Attention Span: Fair  Recall:  [employer] of Knowledge:  Fair  Language:  Fair   Akathisia:  No  Handed:  Right  AIMS (if indicated):     Assets:  Communication Skills Desire for Improvement Housing Physical Health Resilience Social Support  ADL's:  Intact  Cognition:  WNL  Sleep:  Number of Hours: 7.45     Have you used any form of tobacco in the last 30 days? (Cigarettes, Smokeless Tobacco, Cigars, and/or Pipes): No  Has this patient used any form of tobacco in the last 30 days? (Cigarettes, Smokeless Tobacco, Cigars, and/or Pipes) Yes, No  Blood Alcohol level:  Lab Results  Component Value Date   ETH <10 (H) 07/28/2017    Metabolic Disorder Labs:  Lab Results  Component Value Date   HGBA1C 4.9 07/30/2017   MPG 93.93 07/30/2017   No results found for: PROLACTIN Lab Results  Component Value Date   CHOL 135 07/30/2017   TRIG 95 07/30/2017   HDL 32 (L) 07/30/2017   CHOLHDL 4.2 07/30/2017   VLDL 19 07/30/2017   LDLCALC 84 07/30/2017    See Psychiatric Specialty Exam and Suicide Risk Assessment  completed by Attending Physician prior to discharge.  Discharge destination:  Home  Is patient on multiple antipsychotic therapies at discharge:  No   Has Patient had three or more failed trials of antipsychotic monotherapy by history:  No  Recommended Plan for Multiple Antipsychotic Therapies: NA  Discharge Instructions    Diet - low sodium heart healthy    Complete by:  As directed    Increase activity slowly    Complete by:  As directed      Allergies as of 08/06/2017   Not on File     Medication List    TAKE these medications     Indication  lithium carbonate 300 MG CR tablet Commonly known as:  LITHOBID Take 2 tablets (600 mg total) by mouth every 12 (twelve) hours.  Indication:  Manic-Depression   OLANZapine 15 MG tablet Commonly known as:  ZYPREXA Take 2 tablets (30 mg total) by mouth at bedtime.  Indication:  Manic-Depression   traZODone 100 MG tablet Commonly known as:  DESYREL Take 1 tablet (100 mg total) by mouth at  bedtime as needed for sleep.  Indication:  Trouble Sleeping      Follow-up Information    [facility] Go on 08/10/2017.   Why:  Please meet Unk Pinto, [facility], for your intake appointment on Monday, 08/10/17, at 7:15am.  Please bring a copy of your hospital discharge paperwork. Contact information: [address] [place] [address] [phone]           Follow-up recommendations:  Activity:  as tolerated. Diet:  regular. Other:  keep follow up appointments.  Comments:    Signed: Kristine Linea, MD 08/06/2017, 9:50 AM

## 2017-08-06 NOTE — Progress Notes (Signed)
Patient ID: Darryl Hughes, male   DOB: Mar 04, 1996, 21 y.o.   MRN: [id number]   Zaniel received his discharge order, the discharge instructions was reviewed with questions answered. He received his medications (7 day supply) and the prescriptions with the medication management form. His personal belongings were returned. He was discharged with his mother without incident.

## 2017-08-06 NOTE — [facility]HH Suicide Risk Assessment (Signed)
[facility] Discharge Suicide Risk Assessment   Principal Problem: Bipolar I disorder, most recent episode mixed, severe with psychotic features [facility]) Discharge Diagnoses:  Patient Active Problem List   Diagnosis Date Noted  . Bipolar I disorder, most recent episode mixed, severe with psychotic features (HCC) [F31.64] 07/29/2017  . 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   HENT: Negative.   Eyes: Negative.   Respiratory: Negative.   Cardiovascular: Negative.   Gastrointestinal: Negative.   Genitourinary: Negative.   Musculoskeletal: Negative.   Skin: Negative.   Neurological: Negative.   Endo/Heme/Allergies: Negative.   Psychiatric/Behavioral: Negative.     Blood pressure 117/80, pulse (!) 57, temperature 97.8 F (36.6 C), resp. rate 18, height  (1.727 m), weight 63.5 kg (140 lb), SpO2 100 %.Body mass index is 21.29 kg/m.  General Appearance: Casual  Eye Contact::  Good  Speech:  Clear and Coherent409  Volume:  Normal  Mood:  Euthymic  Affect:  Appropriate  Thought Process:  Goal Directed and Descriptions of Associations: Intact  Orientation:  Full (Time, Place, and Person)  Thought Content:  WDL  Suicidal Thoughts:  No  Homicidal Thoughts:  No  Memory:  Immediate;   Fair Recent;   Fair Remote;   Fair  Judgement:  Impaired  Insight:  Present  Psychomotor Activity:  Normal  Concentration:  Fair  Recall:  [employer] of Knowledge:Fair  Language: Fair  Akathisia:  No  Handed:  Right  AIMS (if indicated):     Assets:  Communication Skills Desire for Improvement Housing Physical Health Resilience Social Support  Sleep:  Number of Hours: 7.45  Cognition: WNL  ADL's:  Intact   Mental Status Per Nursing Assessment::   On Admission:      Demographic Factors:  Male, Adolescent or young adult, Caucasian and Unemployed  Loss Factors: NA  Historical Factors: Family history of mental illness or substance abuse and Impulsivity  Risk Reduction Factors:   Sense of responsibility to family, Living with another person, especially a relative and Positive social support  Continued Clinical Symptoms:  Bipolar Disorder:   Mixed State  Cognitive Features That Contribute To Risk:  None    Suicide Risk:  Minimal: No identifiable suicidal ideation.  Patients presenting with no risk factors but with morbid ruminations; may be classified as minimal risk based on the severity of the depressive symptoms  Follow-up Information    [facility] Go on 08/10/2017.   Why:  Please meet Unk Pinto, [facility], for your intake appointment on Monday, 08/10/17, at 7:15am.  Please bring a copy of your hospital discharge paperwork. Contact information: [address] [place] [address] [phone]           Plan Of Care/Follow-up recommendations:  Activity:  as tolerated. Diet:  regular. Other:  keep follow up appointments.  Kristine Linea, MD 08/06/2017, 7:43 AM

## 2017-08-06 NOTE — Progress Notes (Signed)
  [facility] Adult Case Management Discharge Plan :  Will you be returning to the same living situation after discharge:  Yes,  with mother At discharge, do you have transportation home?: Yes,  mother Do you have the ability to pay for your medications: No. Medication management referral sent.  Release of information consent forms completed and in the chart;  Patient's signature needed at discharge.  Patient to Follow up at: Follow-up Information    [facility] Go on 08/10/2017.   Why:  Please meet Unk Pinto, [facility], for your intake appointment on Monday, 08/10/17, at 7:15am.  Please bring a copy of your hospital discharge paperwork. Contact information: [address] [place] [address] [phone]           Next level of care provider has access to [facility] Link:no  Safety Planning and Suicide Prevention discussed: Yes,  with mother  Have you used any form of tobacco in the last 30 days? (Cigarettes, Smokeless Tobacco, Cigars, and/or Pipes): No  Has patient been referred to the Quitline?: N/A patient is not a smoker  Patient has been referred for addiction treatment: Yes  Lorri Frederick, LCSW 08/06/2017, 10:38 AM

## 2018-02-01 ENCOUNTER — Inpatient Hospital Stay
Admission: AD | Admit: 2018-02-01 | Discharge: 2018-02-09 | DRG: 885 | Disposition: A | Discharge status: Home / Self Care | Payer: No Typology Code available for payment source | Attending: Psychiatry | Admitting: Psychiatry

## 2018-02-01 ENCOUNTER — Emergency Department
Admission: EM | Admit: 2018-02-01 | Discharge: 2018-02-01 | Disposition: A | Discharge status: Other Acute Inpatient Hospital | Payer: Self-pay | Attending: Emergency Medicine | Admitting: Emergency Medicine

## 2018-02-01 ENCOUNTER — Other Ambulatory Visit: Payer: Self-pay

## 2018-02-01 ENCOUNTER — Encounter: Payer: Self-pay | Admitting: Emergency Medicine

## 2018-02-01 DIAGNOSIS — R410 Disorientation, unspecified: Secondary | ICD-10-CM | POA: Diagnosis not present

## 2018-02-01 DIAGNOSIS — F250 Schizoaffective disorder, bipolar type: Secondary | ICD-10-CM | POA: Diagnosis not present

## 2018-02-01 DIAGNOSIS — F3164 Bipolar disorder, current episode mixed, severe, with psychotic features: Secondary | ICD-10-CM

## 2018-02-01 DIAGNOSIS — Z915 Personal history of self-harm: Secondary | ICD-10-CM | POA: Diagnosis not present

## 2018-02-01 DIAGNOSIS — F419 Anxiety disorder, unspecified: Secondary | ICD-10-CM | POA: Diagnosis present

## 2018-02-01 DIAGNOSIS — F1690 Hallucinogen use, unspecified, uncomplicated: Secondary | ICD-10-CM | POA: Diagnosis present

## 2018-02-01 DIAGNOSIS — Z79899 Other long term (current) drug therapy: Secondary | ICD-10-CM | POA: Insufficient documentation

## 2018-02-01 DIAGNOSIS — H5462 Unqualified visual loss, left eye, normal vision right eye: Secondary | ICD-10-CM | POA: Diagnosis present

## 2018-02-01 DIAGNOSIS — F1210 Cannabis abuse, uncomplicated: Secondary | ICD-10-CM | POA: Diagnosis present

## 2018-02-01 DIAGNOSIS — Z9114 Patient's other noncompliance with medication regimen: Secondary | ICD-10-CM

## 2018-02-01 DIAGNOSIS — F1410 Cocaine abuse, uncomplicated: Secondary | ICD-10-CM | POA: Diagnosis present

## 2018-02-01 DIAGNOSIS — F329 Major depressive disorder, single episode, unspecified: Secondary | ICD-10-CM | POA: Insufficient documentation

## 2018-02-01 DIAGNOSIS — F1910 Other psychoactive substance abuse, uncomplicated: Secondary | ICD-10-CM | POA: Insufficient documentation

## 2018-02-01 DIAGNOSIS — Z7289 Other problems related to lifestyle: Secondary | ICD-10-CM | POA: Diagnosis not present

## 2018-02-01 DIAGNOSIS — Z79891 Long term (current) use of opiate analgesic: Secondary | ICD-10-CM

## 2018-02-01 DIAGNOSIS — Z046 Encounter for general psychiatric examination, requested by authority: Secondary | ICD-10-CM | POA: Insufficient documentation

## 2018-02-01 DIAGNOSIS — R462 Strange and inexplicable behavior: Secondary | ICD-10-CM

## 2018-02-01 DIAGNOSIS — F259 Schizoaffective disorder, unspecified: Secondary | ICD-10-CM

## 2018-02-01 HISTORY — DX: Depression, unspecified: F32.A

## 2018-02-01 HISTORY — DX: Blindness, one eye, unspecified eye: H54.40

## 2018-02-01 HISTORY — DX: Bipolar disorder, unspecified: F31.9

## 2018-02-01 HISTORY — DX: Major depressive disorder, single episode, unspecified: F32.9

## 2018-02-01 LAB — CBC
HEMATOCRIT: 46.9 % (ref 40.0–52.0)
HEMOGLOBIN: 16.4 g/dL (ref 13.0–18.0)
MCH: 30.4 pg (ref 26.0–34.0)
MCHC: 35 g/dL (ref 32.0–36.0)
MCV: 86.9 fL (ref 80.0–100.0)
Platelets: 283 10*3/uL (ref 150–440)
RBC: 5.4 MIL/uL (ref 4.40–5.90)
RDW: 12.7 % (ref 11.5–14.5)
WBC: 9.4 10*3/uL (ref 3.8–10.6)

## 2018-02-01 LAB — COMPREHENSIVE METABOLIC PANEL
ALBUMIN: 4.8 g/dL (ref 3.5–5.0)
ALK PHOS: 69 U/L (ref 38–126)
ALT: 26 U/L (ref 17–63)
AST: 23 U/L (ref 15–41)
Anion gap: 9 (ref 5–15)
BUN: 13 mg/dL (ref 6–20)
CALCIUM: 9.5 mg/dL (ref 8.9–10.3)
CHLORIDE: 107 mmol/L (ref 101–111)
CO2: 24 mmol/L (ref 22–32)
CREATININE: 0.81 mg/dL (ref 0.61–1.24)
GFR calc Af Amer: >60 mL/min (ref >60)
GFR calc non Af Amer: >60 mL/min (ref >60)
GLUCOSE: 103 mg/dL — AB (ref 65–99)
Potassium: 4.1 mmol/L (ref 3.5–5.1)
SODIUM: 140 mmol/L (ref 135–145)
Total Bilirubin: 1.8 mg/dL — ABNORMAL HIGH (ref 0.3–1.2)
Total Protein: 8.1 g/dL (ref 6.5–8.1)

## 2018-02-01 LAB — URINE DRUG SCREEN, QUALITATIVE (ARMC ONLY)
Amphetamines, Ur Screen: NOT DETECTED
BARBITURATES, UR SCREEN: NOT DETECTED
Benzodiazepine, Ur Scrn: NOT DETECTED
CANNABINOID 50 NG, UR ~~LOC~~: POSITIVE — AB
COCAINE METABOLITE, UR ~~LOC~~: POSITIVE — AB
MDMA (ECSTASY) UR SCREEN: NOT DETECTED
Methadone Scn, Ur: NOT DETECTED
OPIATE, UR SCREEN: NOT DETECTED
Phencyclidine (PCP) Ur S: NOT DETECTED
Tricyclic, Ur Screen: NOT DETECTED

## 2018-02-01 LAB — LITHIUM LEVEL: Lithium Lvl: 0.13 mmol/L — ABNORMAL LOW (ref 0.60–1.20)

## 2018-02-01 LAB — SALICYLATE LEVEL: Salicylate Lvl: <7 mg/dL (ref 2.8–30.0)

## 2018-02-01 LAB — ACETAMINOPHEN LEVEL: Acetaminophen (Tylenol), Serum: <10 ug/mL — ABNORMAL LOW (ref 10–30)

## 2018-02-01 LAB — ETHANOL: Alcohol, Ethyl (B): <10 mg/dL (ref <10)

## 2018-02-01 MED ORDER — OLANZAPINE 10 MG PO TABS
30.0000 mg | ORAL_TABLET | Freq: Every day | ORAL | Status: DC
  Administered 2018-02-01: 30 mg via ORAL
  Filled 2018-02-01: qty 3

## 2018-02-01 MED ORDER — LITHIUM CARBONATE 300 MG PO CAPS
600.0000 mg | ORAL_CAPSULE | Freq: Two times a day (BID) | ORAL | Status: DC
  Administered 2018-02-02 – 2018-02-05 (×7): 600 mg via ORAL
  Filled 2018-02-01 (×7): qty 2

## 2018-02-01 MED ORDER — TRAZODONE HCL 100 MG PO TABS
100.0000 mg | ORAL_TABLET | Freq: Every evening | ORAL | Status: DC | PRN
  Administered 2018-02-01 – 2018-02-04 (×4): 100 mg via ORAL
  Filled 2018-02-01 (×4): qty 1

## 2018-02-01 MED ORDER — ALUM & MAG HYDROXIDE-SIMETH 200-200-20 MG/5ML PO SUSP: 30.0000 mL | ORAL | Status: DC | PRN

## 2018-02-01 MED ORDER — OLANZAPINE 10 MG PO TABS: 30.0000 mg | ORAL_TABLET | Freq: Every day | ORAL | Status: DC

## 2018-02-01 MED ORDER — LITHIUM CARBONATE 300 MG PO CAPS
600.0000 mg | ORAL_CAPSULE | Freq: Two times a day (BID) | ORAL | Status: DC
  Administered 2018-02-01: 600 mg via ORAL
  Filled 2018-02-01 (×2): qty 2

## 2018-02-01 MED ORDER — TRAZODONE HCL 100 MG PO TABS: 100.0000 mg | ORAL_TABLET | Freq: Every evening | ORAL | Status: DC | PRN

## 2018-02-01 MED ORDER — MAGNESIUM HYDROXIDE 400 MG/5ML PO SUSP: 30.0000 mL | Freq: Every day | ORAL | Status: DC | PRN

## 2018-02-01 MED ORDER — ACETAMINOPHEN 325 MG PO TABS: 650.0000 mg | ORAL_TABLET | Freq: Four times a day (QID) | ORAL | Status: DC | PRN

## 2018-02-01 NOTE — Progress Notes (Addendum)
Patient ID: Darryl Hughes, male   DOB: 03/09/1996, 22 y.o.   MRN: [id number] 22 year old caucasian male admitted IVC for non-compliance with medications, bizarre behavior, and disorganized thinking. Upon approach, patient is in scrubs with body odor. Throughout assessment patient laughs inappropriately. Denies AVH/HI/SI upon admission. Reports using marijuana daily, LSD occasionally (with friends), cocaine occasionally (with friends), and alcohol less than once a month. UDS +cocaine and THC. Denies smoking tobacco. Patient reports that when he takes LSD, afterwards he feels "I don't know, just different, not right, it depends on who it's with, some people I'm fine, others make me lonely." Patient states that he has been inpatient psych many different times but unable to give dates. Patient reports he is non-compliant with his medications once they start working. 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 Patient denies following doctor's instructions. Reports living with sister and being employed. Patient unable to state what his goal is for being here. Patient thought processes are disorganized. Compliant with EKG, placed in chart. Compliant with HS medications. Provided food and drink. Provided hygiene products and linen. Patient signed forms. Initially arrived with no belongings. Called ext [phone] and was informed that patient does have belongings and they will be brought to us soon. Reoriented to unit and room. Given Trazodone for sleep. Will monitor for efficacy. Q 15 minute checks initiated. Will continue to monitor throughout the shift.   Received belongings from ED. No contraband found. Inventory completed. Patient slept 7.5 hours. No apparent distress. Will endorse care to oncoming shift. After morning VS, patient  experienced vertigo. Patient was assisted to room, given Gatorade, and encouraged to drink fluids. Patient verbalized understanding.

## 2018-02-01 NOTE — Consult Note (Addendum)
Darryl Hughes Psychiatry Consult   Reason for Consult: Consult for this 22 year old man with a history of psychosis Referring Physician: Corky Hughes Patient Identification: Darryl Hughes MRN:  [id number] Principal Diagnosis: Bipolar I disorder, most recent episode mixed, severe with psychotic features [facility]) Diagnosis:   Patient Active Problem List   Diagnosis Date Noted  . Cocaine abuse ([facility]) [F14.10] 02/01/2018  . 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 Bipolar I disorder, most recent episode mixed, severe with psychotic features ([facility]) [F31.64] 07/29/2017  . Cannabis use disorder, mild, abuse [F12.10] 07/28/2017    Total Time spent with patient: 1 hour  Subjective:   Darryl Hughes is a 22 y.o. male patient admitted with "I had an emergency".  HPI: Patient interviewed.  Chart reviewed.  22 year old man with a history of psychotic disorder.  Brought himself into the hospital and initially was making bizarre confused statements about being "rusty".  Patient has been withdrawn during the day but has not tried to hurt himself.  On interview this afternoon patient is at a loss to describe his situation.  He says that he is having problems with his brain.  Talks about being very depressed and having trouble sleeping.  Talks about having thoughts about killing himself.  Admits to having hallucinations.  Patient has not been on his psychiatric medicine although he seems to indicate that he recently restarted his lithium which he had stopped a long time ago.  He says that he took LSD 3 days ago.  Unclear what other drugs he is taking although he seems to suggest he is still using marijuana.  Social history: Patient is living with his sister and her family.  Had been working at [employer] but has called out for the last several days because of his mental health problems.  Substance abuse history: History of abuse of cannabis and LSD by his report.  Current drug screen positive also for  cocaine  Medical history: No significant medical problems outside of the mental health  Past Psychiatric History: Patient has had a previous admission to our hospital last autumn.  At that time he was very psychotic when he first came in and took several days to recover with doses of lithium and Zyprexa.  Patient stopped taking his medicine fairly soon after discharge.  Followed up only briefly with Darryl Hughes.  Risk to Self: Suicidal Ideation: No Suicidal Intent: No Is patient at risk for suicide?: No Suicidal Plan?: No Access to Means: No What has been your use of drugs/alcohol within the last 12 months?: Acid, Cocaine, "Mollies (LSD)," Cannabis and Alcohol How many times?: 0 Other Self Harm Risks: Active Addiction Triggers for Past Attempts: None known Intentional Self Injurious Behavior: None Risk to Others: Homicidal Ideation: No Thoughts of Harm to Others: No Current Homicidal Intent: No Current Homicidal Plan: No Access to Homicidal Means: No Identified Victim: Reports of none History of harm to others?: No Violent Behavior Description: Reports of none Does patient have access to weapons?: No Criminal Charges Pending?: No Does patient have a court date: No Prior Inpatient Therapy: Prior Inpatient Therapy: Yes Prior Therapy Dates: 04/2017 Prior Therapy Facilty/Provider(s): [facility] Reason for Treatment: Hallucinations  Prior Outpatient Therapy: Prior Outpatient Therapy: Yes Prior Therapy Dates: Current Prior Therapy Facilty/Provider(s): [facility] Reason for Treatment: Substance Abuse Does patient have an ACCT team?: No Does patient have Intensive In-House Services?  : No Does patient have [facility] services? : No Does patient have P4CC services?: No  Past Medical History:  Past  Medical History:  Diagnosis Date  . Depression     Past Surgical History:  Procedure Laterality Date  . EYE SURGERY     Family History: History reviewed. No pertinent family history. Family Psychiatric   History: Positive for mental health problems Social History:  Social History   Substance and Sexual Activity  Alcohol Use Yes     Social History   Substance and Sexual Activity  Drug Use Yes    Social History   Socioeconomic History  . Marital status: Single    Spouse name: Not on file  . Number of children: Not on file  . Years of education: Not on file  . Highest education level: Not on file  Occupational History  . Not on file  Social Needs  . Financial resource strain: Not on file  . Food insecurity:    Worry: Not on file    Inability: Not on file  . Transportation needs:    Medical: Not on file    Non-medical: Not on file  Tobacco Use  . Smoking status: Never Smoker  . Smokeless tobacco: Never Used  Substance and Sexual Activity  . Alcohol use: Yes  . Drug use: Yes  . Sexual activity: Not on file  Lifestyle  . Physical activity:    Days per week: Not on file    Minutes per session: Not on file  . Stress: Not on file  Relationships  . Social connections:    Talks on phone: Not on file    Gets together: Not on file    Attends religious service: Not on file    Active member of club or organization: Not on file    Attends meetings of clubs or organizations: Not on file    Relationship status: Not on file  Other Topics Concern  . Not on file  Social History Narrative  . Not on file   Additional Social History:    Allergies:  No Known Allergies  Labs:  Results for orders placed or performed during the hospital encounter of 02/01/18 (from the past 48 hour(s))  Comprehensive metabolic panel     Status: Abnormal   Collection Time: 02/01/18  9:03 AM  Result Value Ref Range   Sodium 140 135 - 145 mmol/L   Potassium 4.1 3.5 - 5.1 mmol/L   Chloride 107 101 - 111 mmol/L   CO2 24 22 - 32 mmol/L   Glucose, Bld 103 (H) 65 - 99 mg/dL   BUN 13 6 - 20 mg/dL   Creatinine, Ser 0.81 0.61 - 1.24 mg/dL   Calcium 9.5 8.9 - 10.3 mg/dL   Total Protein 8.1 6.5 - 8.1  g/dL   Albumin 4.8 3.5 - 5.0 g/dL   AST 23 15 - 41 U/L   ALT 26 17 - 63 U/L   Alkaline Phosphatase 69 38 - 126 U/L   Total Bilirubin 1.8 (H) 0.3 - 1.2 mg/dL   GFR calc non Af Amer >60 >60 mL/min   GFR calc Af Amer >60 >60 mL/min    Comment: (NOTE) The eGFR has been calculated using the CKD EPI equation. This calculation has not been validated in all clinical situations. eGFR's persistently <60 mL/min signify possible Chronic Kidney Disease.    Anion gap 9 5 - 15    Comment: Performed at [facility], [address] [place], [place] [address]  Ethanol     Status: None   Collection Time: 02/01/18  9:03 AM  Result Value Ref  Range   Alcohol, Ethyl (B) <10 <10 mg/dL    Comment:        LOWEST DETECTABLE LIMIT FOR SERUM ALCOHOL IS 10 mg/dL FOR MEDICAL PURPOSES ONLY Performed at [facility], [address] [place], [place] [address]   Salicylate level     Status: None   Collection Time: 02/01/18  9:03 AM  Result Value Ref Range   Salicylate Lvl <1.6 2.8 - 30.0 mg/dL    Comment: Performed at [facility], [address] [place], [place] [address]  Acetaminophen level     Status: Abnormal   Collection Time: 02/01/18  9:03 AM  Result Value Ref Range   Acetaminophen (Tylenol), Serum <10 (L) 10 - 30 ug/mL    Comment:        THERAPEUTIC CONCENTRATIONS VARY SIGNIFICANTLY. A RANGE OF 10-30 ug/mL MAY BE AN EFFECTIVE CONCENTRATION FOR MANY PATIENTS. HOWEVER, SOME ARE BEST TREATED AT CONCENTRATIONS OUTSIDE THIS RANGE. ACETAMINOPHEN CONCENTRATIONS >150 ug/mL AT 4 HOURS AFTER INGESTION AND >50 ug/mL AT 12 HOURS AFTER INGESTION ARE OFTEN ASSOCIATED WITH TOXIC REACTIONS. Performed at [facility], [address] [place], [place] [address]   cbc     Status: None   Collection Time: 02/01/18  9:03 AM  Result Value Ref Range   WBC 9.4 3.8 - 10.6 K/uL   RBC 5.40 4.40 - 5.90 MIL/uL   Hemoglobin 16.4 13.0 - 18.0 g/dL   HCT 46.9 40.0 - 52.0 %    MCV 86.9 80.0 - 100.0 fL   MCH 30.4 26.0 - 34.0 pg   MCHC 35.0 32.0 - 36.0 g/dL   RDW 12.7 11.5 - 14.5 %   Platelets 283 150 - 440 K/uL    Comment: Performed at [facility], [address] [place], [place] [address]  Urine Drug Screen, Qualitative     Status: Abnormal   Collection Time: 02/01/18  9:07 AM  Result Value Ref Range   Tricyclic, Ur Screen NONE DETECTED NONE DETECTED   Amphetamines, Ur Screen NONE DETECTED NONE DETECTED   MDMA (Ecstasy)Ur Screen NONE DETECTED NONE DETECTED   Cocaine Metabolite,Ur [place] POSITIVE (A) NONE DETECTED   Opiate, Ur Screen NONE DETECTED NONE DETECTED   Phencyclidine (PCP) Ur S NONE DETECTED NONE DETECTED   Cannabinoid 50 Ng, Ur [place] POSITIVE (A) NONE DETECTED   Barbiturates, Ur Screen NONE DETECTED NONE DETECTED   Benzodiazepine, Ur Scrn NONE DETECTED NONE DETECTED   Methadone Scn, Ur NONE DETECTED NONE DETECTED    Comment: (NOTE) Tricyclics + metabolites, urine    Cutoff 1000 ng/mL Amphetamines + metabolites, urine  Cutoff 1000 ng/mL MDMA (Ecstasy), urine              Cutoff 500 ng/mL Cocaine Metabolite, urine          Cutoff 300 ng/mL Opiate + metabolites, urine        Cutoff 300 ng/mL Phencyclidine (PCP), urine         Cutoff 25 ng/mL Cannabinoid, urine                 Cutoff 50 ng/mL Barbiturates + metabolites, urine  Cutoff 200 ng/mL Benzodiazepine, urine              Cutoff 200 ng/mL Methadone, urine                   Cutoff 300 ng/mL The urine drug screen provides only a preliminary, unconfirmed analytical test result and should not be used for  non-medical purposes. Clinical consideration and professional judgment should be applied to any positive drug screen result due to possible interfering substances. A more specific alternate chemical method must be used in order to obtain a confirmed analytical result. Gas chromatography / mass spectrometry (GC/MS) is the preferred confirmat ory method. Performed at [facility],  [address] [place], [place] [address]   Lithium level     Status: Abnormal   Collection Time: 02/01/18  9:07 AM  Result Value Ref Range   Lithium Lvl 0.13 (L) 0.60 - 1.20 mmol/L    Comment: Performed at [facility], [address] [place], [place] [address]    Current Facility-Administered Medications  Medication Dose Route Frequency Provider Last Rate Last Dose  . lithium carbonate capsule 600 mg  600 mg Oral BID WC Herbie Lehrmann T, MD      . OLANZapine (ZYPREXA) tablet 30 mg  30 mg Oral QHS Teigan Manner T, MD      . traZODone (DESYREL) tablet 100 mg  100 mg Oral QHS PRN Verneal Wiers, Madie Reno, MD       Current Outpatient Medications  Medication Sig Dispense Refill  . lithium carbonate (LITHOBID) 300 MG CR tablet Take 2 tablets (600 mg total) by mouth every 12 (twelve) hours. 120 tablet 1  . OLANZapine (ZYPREXA) 15 MG tablet Take 2 tablets (30 mg total) by mouth at bedtime. 60 tablet 1  . traZODone (DESYREL) 100 MG tablet Take 1 tablet (100 mg total) by mouth at bedtime as needed for sleep. 30 tablet 1    Musculoskeletal: Strength & Muscle Tone: within normal limits Gait & Station: normal Patient leans: N/A  Psychiatric Specialty Exam: Physical Exam  Nursing note and vitals reviewed. Constitutional: He appears well-developed and well-nourished.  HENT:  Head: Normocephalic and atraumatic.  Eyes: Pupils are equal, round, and reactive to light. Conjunctivae are normal.  Neck: Normal range of motion.  Cardiovascular: Normal heart sounds.  Respiratory: Effort normal.  GI: Soft.  Musculoskeletal: Normal range of motion.  Neurological: He is alert.  Skin: Skin is warm and dry.  Psychiatric: His mood appears anxious. His affect is labile and inappropriate. His speech is tangential. He is not hyperactive and not combative. Thought content is delusional. Thought content is not paranoid. Cognition and memory are impaired. He expresses impulsivity. He expresses suicidal  ideation. He expresses no homicidal ideation. He expresses no suicidal plans.    Review of Systems  Constitutional: Negative.   HENT: Negative.   Eyes: Negative.   Respiratory: Negative.   Cardiovascular: Negative.   Gastrointestinal: Negative.   Musculoskeletal: Negative.   Skin: Negative.   Neurological: Negative.   Psychiatric/Behavioral: Positive for depression, hallucinations, substance abuse and suicidal ideas. Negative for memory loss. The patient is nervous/anxious and has insomnia.     Blood pressure (!) 133/107, pulse 73, temperature 98.7 F (37.1 C), temperature source Oral, resp. rate 16, height 5' 10"  (1.778 m), weight 77.1 kg (170 lb), SpO2 100 %.Body mass index is 24.39 kg/m.  General Appearance: Casual  Eye Contact:  Fair  Speech:  Garbled and Slow  Volume:  Decreased  Mood:  Anxious and Dysphoric  Affect:  Constricted  Thought Process:  Disorganized  Orientation:  Full (Time, Place, and Person)  Thought Content:  Illogical, Paranoid Ideation, Rumination and Tangential  Suicidal Thoughts:  Yes.  without intent/plan  Homicidal Thoughts:  No  Memory:  Immediate;   Fair Recent;   Poor Remote;   Fair  Judgement:  Impaired  Insight:  Shallow  Psychomotor Activity:  Decreased  Concentration:  Concentration: Fair  Recall:  [employer] of Knowledge:  Fair  Language:  Fair  Akathisia:  No  Handed:  Right  AIMS (if indicated):     Assets:  Housing Physical Health  ADL's:  Impaired  Cognition:  Impaired,  Mild  Sleep:        Treatment Plan Summary: Daily contact with patient to assess and evaluate symptoms and progress in treatment, Medication management and Plan Patient had previously been diagnosed with bipolar disorder mixed with psychotic features and we will continue that diagnosis.  Admit to psychiatric unit.  Involuntary commitment papers filed.  Labs will be reviewed.  Medications were restarted.  Disposition: Recommend psychiatric Inpatient admission  when medically cleared. Supportive therapy provided about ongoing stressors.  Alethia Berthold, MD 02/01/2018 5:13 PM

## 2018-02-01 NOTE — ED Notes (Addendum)
Pt laying in bed comfortably watching tv, no distress noted. Pt calm and cooperative at this time denies any SI or HI. Pt awaiting admission to beh med unit. Q 15 min checks being done at this time per MD order.

## 2018-02-01 NOTE — BH Assessment (Signed)
Assessment Note  Darryl Hughes is an 22 y.o. male who presents to the ER because "I wasn't feeling right." Patient reports he used an unknown amount of "Acid, Cocaine, "Mollies (LSD)," Cannabis and Alcohol." He states this isn't the first time he has used the different substance. However, with the most recent use, he hasn't felt right. He reports his last time of use was, Saturday 01/30/2018.  During the interview, the patient was able to answer majority of the questions with appropriate answers. Throughout the interview, the patient would laugh at inappropriate times. He would apologize and start talking very low and this writer was unable to hear him. He states he was hearing things and when asked what he has said, patient would start laughing again, then say, "I'm stupid." Patient was calm and attempted to cooperate  Patient denies SI/HI and AV/H. He also denies any involvement with the legal system.  Patient further reports of having no history of violence and aggression.  Diagnosis: Bipolar  Past Medical History:  Past Medical History:  Diagnosis Date  . Depression     Past Surgical History:  Procedure Laterality Date  . EYE SURGERY      Family History: History reviewed. No pertinent family history.  Social History:  reports that he has never smoked. He has never used smokeless tobacco. He reports that he drinks alcohol. He reports that he has current or past drug history.  Additional Social History:  Alcohol / Drug Use Pain Medications: None Reported Prescriptions: None Reported Over the Counter: None Reported History of alcohol / drug use?: Yes Longest period of sobriety (when/how long): Unable to quantify Negative Consequences of Use: Personal relationships Withdrawal Symptoms: (n/a) Substance #1 Name of Substance 1: Acid Substance #2 Name of Substance 2: Cocaine Substance #3 Name of Substance 3: "Mollies (LSD)" Substance #4 Name of Substance 4: Cannabis Substance  #5 Name of Substance 5: Alcohol  CIWA: CIWA-Ar BP: (!) 133/107 Pulse Rate: 73 COWS:    Allergies: No Known Allergies  Home Medications:  (Not in a hospital admission)  OB/GYN Status:  No LMP for male patient.  General Assessment Data [facility] Assessment: In system Is this a Tele or Face-to-Face Assessment?: Face-to-Face Is this an Initial Assessment or a Re-assessment for this encounter?: Initial Assessment Marital status: Single Maiden name: n/a Is patient pregnant?: No Pregnancy Status: No Living Arrangements: Parent Can pt return to current living arrangement?: Yes Admission Status: Voluntary Is patient capable of signing voluntary admission?: Yes Referral Source: Self/Family/Friend Insurance type: None  Medical Screening Exam [facility]in ONLY) Medical Exam completed: Yes  Crisis Care Plan Living Arrangements: Parent Legal Guardian: Other:(Self) Name of Psychiatrist: Reports of none Name of Therapist: Reports of none  Education Status Is patient currently in school?: No Is the patient employed, unemployed or receiving disability?: Unemployed  Risk to self with the past 6 months Suicidal Ideation: No Has patient been a risk to self within the past 6 months prior to admission? : No Suicidal Intent: No Has patient had any suicidal intent within the past 6 months prior to admission? : No Is patient at risk for suicide?: No Suicidal Plan?: No Has patient had any suicidal plan within the past 6 months prior to admission? : No Access to Means: No What has been your use of drugs/alcohol within the last 12 months?: Acid, Cocaine, "Mollies (LSD)," Cannabis and Alcohol Previous Attempts/Gestures: No How many times?: 0 Other Self Harm Risks: Active Addiction Triggers  for Past Attempts: None known Intentional Self Injurious Behavior: None Family Suicide History: No Recent stressful life event(s): Other (Comment)(Active  Addiction) Persecutory voices/beliefs?: No Depression: Yes Depression Symptoms: Guilt, Tearfulness, Isolating, Feeling worthless/self pity Substance abuse history and/or treatment for substance abuse?: Yes Suicide prevention information given to non-admitted patients: Not applicable  Risk to Others within the past 6 months Homicidal Ideation: No Does patient have any lifetime risk of violence toward others beyond the six months prior to admission? : No Thoughts of Harm to Others: No Current Homicidal Intent: No Current Homicidal Plan: No Access to Homicidal Means: No Identified Victim: Reports of none History of harm to others?: No Violent Behavior Description: Reports of none Does patient have access to weapons?: No Criminal Charges Pending?: No Does patient have a court date: No Is patient on probation?: No  Psychosis Hallucinations: Auditory Delusions: None noted  Mental Status Report Appearance/Hygiene: Body odor, In scrubs, In hospital gown Eye Contact: Poor Motor Activity: Freedom of movement Speech: Slurred Level of Consciousness: Alert Mood: Labile, Silly Affect: Appropriate to circumstance Anxiety Level: Minimal Thought Processes: Relevant, Thought Blocking, Irrelevant Judgement: Impaired Orientation: Person, Place, Time, Situation, Appropriate for developmental age Obsessive Compulsive Thoughts/Behaviors: Minimal  Cognitive Functioning Concentration: Normal Memory: Recent Intact, Remote Intact Is patient IDD: No Is patient DD?: No Insight: Fair Impulse Control: Fair Appetite: Fair Have you had any weight changes? : No Change Sleep: Decreased Total Hours of Sleep: 2 Vegetative Symptoms: None  ADLScreening [facility] Assessment Services) Patient's cognitive ability adequate to safely complete daily activities?: Yes Patient able to express need for assistance with ADLs?: Yes Independently performs ADLs?: Yes (appropriate for developmental age)  Prior  Inpatient Therapy Prior Inpatient Therapy: Yes Prior Therapy Dates: 04/2017 Prior Therapy Facilty/Provider(s): [facility] Reason for Treatment: Hallucinations   Prior Outpatient Therapy Prior Outpatient Therapy: Yes Prior Therapy Dates: Current Prior Therapy Facilty/Provider(s): [facility] Reason for Treatment: Substance Abuse Does patient have an ACCT team?: No Does patient have Intensive In-House Services?  : No Does patient have [facility] services? : No Does patient have P4CC services?: No  ADL Screening (condition at time of admission) Patient's cognitive ability adequate to safely complete daily activities?: Yes Is the patient deaf or have difficulty hearing?: No Does the patient have difficulty seeing, even when wearing glasses/contacts?: No Does the patient have difficulty concentrating, remembering, or making decisions?: No Patient able to express need for assistance with ADLs?: Yes Does the patient have difficulty dressing or bathing?: No Independently performs ADLs?: Yes (appropriate for developmental age) Does the patient have difficulty walking or climbing stairs?: No Weakness of Legs: None Weakness of Arms/Hands: None  Home Assistive Devices/Equipment Home Assistive Devices/Equipment: None  Therapy Consults (therapy consults require a physician order) PT Evaluation Needed: No OT Evalulation Needed: No SLP Evaluation Needed: No Abuse/Neglect Assessment (Assessment to be complete while patient is alone) Abuse/Neglect Assessment Can Be Completed: Yes Physical Abuse: Denies Verbal Abuse: Denies Sexual Abuse: Denies Exploitation of patient/patient's resources: Denies Self-Neglect: Denies Values / Beliefs Cultural Requests During Hospitalization: None Spiritual Requests During Hospitalization: None Consults Spiritual Care Consult Needed: No Social Work Consult Needed: No         Child/Adolescent Assessment Running Away Risk: Denies(Patient is an  adult)  Disposition:  Disposition Initial Assessment Completed for this Encounter: Yes  On Site Evaluation by:   Reviewed with Physician:    Lilyan Gilford MS, LCAS, LPC, NCC, CCSI Therapeutic Triage Specialist 02/01/2018 12:18 PM

## 2018-02-01 NOTE — ED Notes (Addendum)
[facility]. Patient care assumed. Patient/RN introduction complete. Will continue to monitor. Q 15 min checks being done at this time.

## 2018-02-01 NOTE — ED Triage Notes (Addendum)
Has been depressed.  Has felt like this last 2 days.  Reports took acid and cocaine and marijuana prior to feeling like this.  Denies SI/HI. Denies hallucinations.  Pt very vague and not willing to explain what is going on.  Poor eye contact. "I am feeling rusty, looks like I need a couple shots".  Behavior in triage is noted to be guarded; keeps laughing.

## 2018-02-01 NOTE — ED Notes (Signed)
Patient taking a shower at this time.  

## 2018-02-01 NOTE — ED Triage Notes (Signed)
Dressed out with Retail bankerofficer mabe and this Charity fundraiserN.  All belongings placed in bag pt bag.  Officer reported to RN that he received call from visitor entrance that pt came in requesting a room. Pt then came to ED and just sat down in lobby without checking in.

## 2018-02-01 NOTE — ED Provider Notes (Signed)
Patient has been seen by Dr. Toni Amendlapacs  He is completing IVC papers on the patient and admitting him to the psychiatry service.   Sharyn CreamerQuale, Raunel Dimartino, MD 02/01/18 [phone]

## 2018-02-01 NOTE — ED Notes (Signed)
Dr. Toni Amendlapacs in room to assess patient.

## 2018-02-01 NOTE — ED Provider Notes (Addendum)
[facility] Emergency Department Provider Note   ____________________________________________    I have reviewed the triage vital signs and the nursing notes.   HISTORY  Chief Complaint Psychiatric Evaluation     HPI Darryl Hughes is a 22 y.o. male with a past medical history of depression as well as apparent bipolar disorder with psychotic features who presents for psychiatric evaluation.  History is difficult to obtain as the patient appears somewhat confused.  He admits to drug use 2 days ago at which point he "dropped acid "and has been feeling "rusty since then ".  He states this is happened to him in the past as well.  He denies hallucinations.  No nausea or vomiting.  States that his heart hurts because he misses his family although apparently they are at work when he was able to see them recently.  Past Medical History:  Diagnosis Date  . Depression     Patient Active Problem List   Diagnosis Date Noted  . 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 Bipolar I disorder, most recent episode mixed, severe with psychotic features (HCC) 07/29/2017  . Cannabis use disorder, mild, abuse 07/28/2017    Past Surgical History:  Procedure Laterality Date  . EYE SURGERY      Prior to Admission medications   Medication Sig Start Date End Date Taking? Authorizing Provider  lithium carbonate (LITHOBID) 300 MG CR tablet Take 2 tablets (600 mg total) by mouth every 12 (twelve) hours. 08/05/17   Pucilowska, Jolanta B, MD  OLANZapine (ZYPREXA) 15 MG tablet Take 2 tablets (30 mg total) by mouth at bedtime. 08/05/17   Pucilowska, Braulio ConteJolanta B, MD  traZODone (DESYREL) 100 MG tablet Take 1 tablet (100 mg total) by mouth at bedtime as needed for sleep. 08/05/17   Pucilowska, Ellin GoodieJolanta B, MD     Allergies Patient has no known allergies.  History reviewed. No pertinent family history.  Social History Social History   Tobacco Use  . Smoking status: Never Smoker  .  Smokeless tobacco: Never Used  Substance Use Topics  . Alcohol use: Yes  . Drug use: Yes    Review of Systems  Constitutional: No fever/chills Eyes: No visual changes.  ENT: No neck pain Cardiovascular: Denies chest pain. Respiratory: Denies shortness of breath. Gastrointestinal: Denies abdominal pain Genitourinary: Denies dysuria Musculoskeletal: No muscle aches Skin: No injuries Neurological: Negative for headaches or weakness   ____________________________________________   PHYSICAL EXAM:  VITAL SIGNS: ED Triage Vitals  Enc Vitals Group     BP 02/01/18 0829 (!) 133/107     Pulse Rate 02/01/18 0829 73     Resp 02/01/18 0829 16     Temp 02/01/18 0829 98.7 F (37.1 C)     Temp Source 02/01/18 0829 Oral     SpO2 02/01/18 0829 100 %     Weight 02/01/18 0831 77.1 kg (170 lb)     Height 02/01/18 0831 1.778 m (5\' 10" )     Head Circumference --      Peak Flow --      Pain Score 02/01/18 0831 0     Pain Loc --      Pain Edu? --      Excl. in GC? --     Constitutional: Alert NoneNo acute distress.  Eyes: Conjunctivae are normal.  Head: Atraumatic. Nose: No congestion/rhinnorhea. Mouth/Throat: Mucous membranes are moist.    Cardiovascular: Normal rate, regular rhythm. Grossly normal heart sounds.  Good peripheral circulation. Respiratory: Normal  respiratory effort.  No retractions. Lungs CTAB. Gastrointestinal: Soft and nontender. No distention.  No CVA tenderness. Genitourinary: deferred Musculoskeletal:  Warm and well perfused Neurologic:  Normal speech and language. No gross focal neurologic deficits are appreciated.  Skin:  Skin is warm, dry and intact. No rash noted. Psychiatric: Speech is normal, behavioral is unusual but not quite rising to the level of psychotic  ____________________________________________   LABS (all labs ordered are listed, but only abnormal results are displayed)  Labs Reviewed  COMPREHENSIVE METABOLIC PANEL - Abnormal; Notable for  the following components:      Result Value   Glucose, Bld 103 (*)    Total Bilirubin 1.8 (*)    All other components within normal limits  ACETAMINOPHEN LEVEL - Abnormal; Notable for the following components:   Acetaminophen (Tylenol), Serum <10 (*)    All other components within normal limits  URINE DRUG SCREEN, QUALITATIVE ([facility] ONLY) - Abnormal; Notable for the following components:   Cocaine Metabolite,Ur [place] POSITIVE (*)    Cannabinoid 50 Ng, Ur [place] POSITIVE (*)    All other components within normal limits  LITHIUM LEVEL - Abnormal; Notable for the following components:   Lithium Lvl 0.13 (*)    All other components within normal limits  ETHANOL  SALICYLATE LEVEL  CBC   ____________________________________________  EKG  ED ECG REPORT I, Jene Every, the attending physician, personally viewed and interpreted this ECG.  Date: 02/13/2018  Rhythm: normal sinus rhythm QRS Axis: normal Intervals: normal ST/T Wave abnormalities: normal Narrative Interpretation: no evidence of acute ischemia  ____________________________________________  RADIOLOGY  None ____________________________________________   PROCEDURES  Procedure(s) performed: No  Procedures   Critical Care performed: No ____________________________________________   INITIAL IMPRESSION / ASSESSMENT AND PLAN / ED COURSE  Pertinent labs & imaging results that were available during my care of the patient were reviewed by me and considered in my medical decision making (see chart for details).  Patient with a history of bipolar disorder with psychotic features presents for psychiatric evaluation today.  There is no polysubstance use recently especially with psychoactive medication LSD may be responsible her symptoms today or perhaps this is a exacerbation of his bipolar disorder.  We will check labs including lithium level and reevaluate    ____________________________________________   FINAL CLINICAL  IMPRESSION(S) / ED DIAGNOSES  Final diagnoses:  Polysubstance abuse (HCC)  Bizarre behavior        Note:  This document was prepared using Dragon voice recognition software and may include unintentional dictation errors.    Jene Every, MD 02/01/18 1534    Jene Every, MD 02/13/18 [phone]

## 2018-02-01 NOTE — BH Assessment (Signed)
Per Dr. Toni Amendlapacs, patient meets inpatient criteria. [facility] when [facility].

## 2018-02-01 NOTE — ED Notes (Signed)
MD in room to assess patient.  Will continue to monitor.   

## 2018-02-01 NOTE — Tx Team (Signed)
Initial Treatment Plan 02/01/2018 11:49 PM Darryl Hughes [id number]    PATIENT STRESSORS: Medication change or noncompliance Substance abuse   PATIENT STRENGTHS: Active sense of humor Average or above average intelligence Capable of independent living Communication skills Supportive family/friends   PATIENT IDENTIFIED PROBLEMS: Psychosis 02/01/2018                     DISCHARGE CRITERIA:  Ability to meet basic life and health needs Motivation to continue treatment in a less acute level of care Verbal commitment to aftercare and medication compliance  PRELIMINARY DISCHARGE PLAN: Outpatient therapy Return to previous living arrangement  PATIENT/FAMILY INVOLVEMENT: This treatment plan has been presented to and reviewed with the patient, Darryl Hughes, and/or family member.  The patient and family have been given the opportunity to ask questions and make suggestions.  Galen ManilaAlexis E Evelynne Spiers, RN 02/01/2018, 11:49 PM

## 2018-02-01 NOTE — ED Notes (Signed)
Report given to [employer] RN [facility].

## 2018-02-01 NOTE — Plan of Care (Signed)
New Admit Problem: Activity: Goal: Risk for activity intolerance will decrease Outcome: Not Progressing   Problem: Elimination: Goal: Will not experience complications related to bowel motility Outcome: Not Progressing   Problem: Activity: Goal: Will verbalize the importance of balancing activity with adequate rest periods Outcome: Not Progressing   Problem: Education: Goal: Will be free of psychotic symptoms Outcome: Not Progressing Goal: Knowledge of the prescribed therapeutic regimen will improve Outcome: Not Progressing   Problem: Coping: Goal: Coping ability will improve Outcome: Not Progressing Goal: Will verbalize feelings Outcome: Not Progressing   Problem: Health Behavior/Discharge Planning: Goal: Compliance with prescribed medication regimen will improve Outcome: Not Progressing   Problem: Nutritional: Goal: Ability to achieve adequate nutritional intake will improve Outcome: Not Progressing   Problem: Role Relationship: Goal: Ability to communicate needs accurately will improve Outcome: Not Progressing Goal: Ability to interact with others will improve Outcome: Not Progressing   Problem: Self-Care: Goal: Ability to participate in self-care as condition permits will improve Outcome: Not Progressing   Problem: Self-Concept: Goal: Will verbalize positive feelings about self Outcome: Not Progressing

## 2018-02-02 DIAGNOSIS — F259 Schizoaffective disorder, unspecified: Secondary | ICD-10-CM

## 2018-02-02 DIAGNOSIS — F250 Schizoaffective disorder, bipolar type: Secondary | ICD-10-CM

## 2018-02-02 LAB — LIPID PANEL
CHOL/HDL RATIO: 4.7 ratio
CHOLESTEROL: 170 mg/dL (ref 0–200)
HDL: 36 mg/dL — AB (ref >40)
LDL Cholesterol: 112 mg/dL — ABNORMAL HIGH (ref 0–99)
TRIGLYCERIDES: 108 mg/dL (ref <150)
VLDL: 22 mg/dL (ref 0–40)

## 2018-02-02 LAB — HEMOGLOBIN A1C
Hgb A1c MFr Bld: 5 % (ref 4.8–5.6)
MEAN PLASMA GLUCOSE: 96.8 mg/dL

## 2018-02-02 LAB — TSH: TSH: 4.02 u[IU]/mL (ref 0.350–4.500)

## 2018-02-02 MED ORDER — OLANZAPINE 10 MG PO TABS
15.0000 mg | ORAL_TABLET | Freq: Every day | ORAL | Status: DC
  Administered 2018-02-02: 15 mg via ORAL
  Filled 2018-02-02: qty 2

## 2018-02-02 NOTE — [facility] Suicide Risk Assessment (Signed)
[facility] INPATIENT:  Family/Significant Other Suicide Prevention Education  Suicide Prevention Education:  Education Completed; Darryl Hughes, Mother, [phone] has been identified by the patient as the family member/significant other with whom the patient will be residing, and identified as the person(s) who will aid the patient in the event of a mental health crisis (suicidal ideations/suicide attempt).  With written consent from the patient, the family member/significant other has been provided the following suicide prevention education, prior to the and/or following the discharge of the patient.  The suicide prevention education provided includes the following:  Suicide risk factors  Suicide prevention and interventions  National Suicide Hotline telephone number  [facility] assessment telephone number  [facility] Emergency Assistance 911  [facility] and/or [facility]idential Mobile Crisis Unit telephone number  Request made of family/significant other to:  Remove weapons (e.g., guns, rifles, knives), all items previously/currently identified as safety concern.    Remove drugs/medications (over-the-counter, prescriptions, illicit drugs), all items previously/currently identified as a safety concern.  The family member/significant other verbalizes understanding of the suicide prevention education information provided.  The family member/significant other agrees to remove the items of safety concern listed above.  She reports that she is looking for a place where he can come and live with her. She says that he is a smart kid and she is wanting to be involved more to support him. She says that he needs different friends because they are all experimenting with drugs and that they are taking advantage of him. She is going to work with him to help him stay on his medications. Mother reports that he does not have any access to any guns or weapons.    Darryl Hughes 02/02/2018,  10:32 AM

## 2018-02-02 NOTE — H&P (Addendum)
Psychiatric Admission Assessment Adult  Patient Identification: Darryl Hughes MRN:  [id number] Date of Evaluation:  02/02/2018 Chief Complaint:  "I've not been myself like usual." Principal Diagnosis: Schizoaffective disorder (HCC) Diagnosis:   Patient Active Problem List   Diagnosis Date Noted  . Cocaine abuse (HCC) [F14.10] 02/01/2018  . Bipolar I disorder, single manic episode, severe, with psychosis (HCC) [F30.2] 02/01/2018  . 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 Bipolar I disorder, most recent episode mixed, severe with psychotic features (HCC) [F31.64] 07/29/2017  . Cannabis use disorder, mild, abuse [F12.10] 07/28/2017   History of Present Illness: 22 yo male admitted due to confusion and decompensation. He was admitted to the unit in September for acute psychosis. He was treated with Zyprexa and lithium and improved on these medications. He states that he took them for awhile after discharge but then felt better so he stopped them. Pt states that he decided to come to the ED because, "I haven't been myself like usual. Something is messing with me." He has a lot of difficulty describing his symptoms. He states, "I guess I"m not talking as much and not giving my friend support. They are trying." He states that he did use 2 hits of acid 3 days ago and this is when he noticed th is. He also used cocaine 2 days ago. He states that he is also feeling very depressed. He states that he felt great prior to this and was doing well at work. He reports not sleeping as well recently but unable to elaborate much. He has significant thought blocking and difficulty with concentration and describing his symptoms. When asked about AH, he states, "Um.Marland KitchenMarland KitchenMarland KitchenYeah I think that's going on." He is not able to elaborate on this at all. He denies SI or HI. He is fully oriented to person, place and date. HE is able to give most of his history. He states that he is very close with his mother and gives me permission to  speak with her.  I spoke with his mother, Sheilah Pigeon at [phone]. She states that his symptoms are very similar to the last presentation however has not progressed to that point yet. She has not been living with him recently so has not kept a close eye on him. They do work together at [employer]. She states that for the past 1-2 weeks she has noticed him to "have a distant look in his eye. Like he is in another world." She states that he did break down to her recently and told her that "it is unbearable again." He was able to elaborate much to her on this. She stats, "He is not quite off the wall like he was in the past but there is something off." She states that he is usually very outgoing and a very hard worker but has been in a daze recently. She states that he was doing better on medications. She is aware that he is using acid. She does not think he is using daily because he is living with his sister and they do not tolerate drug use. However, he is likely using more frequently than he is stating. She states taht he hangs with a bad crowd and they use all together. She states that she thinks he is also using edibles.   Associated Signs/Symptoms: Depression Symptoms:  depressed mood, fatigue, hopelessness, (Hypo) Manic Symptoms:  Impulsivity, Anxiety Symptoms:  Excessive Worry, Psychotic Symptoms:  Hallucinations: Auditory Ideas of Reference, Paranoia, PTSD Symptoms: Negative Total Time spent with  patient: 45 minutes  Past Psychiatric History: He has had one past hospitalization in September and was diagnosed with Bipolar disorder. He was started on Lithium and Zyprexa. He followed up with [facility] at least one time but not consistently. HE reports a past suicide attempt by cutting self.   Is the patient at risk to self? Yes.    Has the patient been a risk to self in the past 6 months? No.  Has the patient been a risk to self within the distant past? No.  Is the patient a risk to others? No.  Has  the patient been a risk to others in the past 6 months? No.  Has the patient been a risk to others within the distant past? No.   Alcohol Screening: 1. How often do you have a drink containing alcohol?: Monthly or less 2. How many drinks containing alcohol do you have on a typical day when you are drinking?: 1 or 2 3. How often do you have six or more drinks on one occasion?: Never AUDIT-C Score: 1 4. How often during the last year have you found that you were not able to stop drinking once you had started?: Never 5. How often during the last year have you failed to do what was normally expected from you becasue of drinking?: Never 6. How often during the last year have you needed a first drink in the morning to get yourself going after a heavy drinking session?: Never 7. How often during the last year have you had a feeling of guilt of remorse after drinking?: Never 8. How often during the last year have you been unable to remember what happened the night before because you had been drinking?: Never 9. Have you or someone else been injured as a result of your drinking?: No 10. Has a relative or friend or a doctor or another health worker been concerned about your drinking or suggested you cut down?: No Alcohol Use Disorder Identification Test Final Score (AUDIT): 1 Intervention/Follow-up: AUDIT Score <7 follow-up not indicated Substance Abuse History in the last 12 months:  Yes.  , cocaine use "last use 2 days ago." Acid use-"once month." Smokes a blunt of marijuana daily Consequences of Substance Abuse: Medical Consequences:  Psychosis Previous Psychotropic Medications: Yes  Psychological Evaluations: Yes  Past Medical History:  Past Medical History:  Diagnosis Date  . Bipolar disorder (HCC)   . 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 Depression     Past Surgical History:  Procedure Laterality Date  . EYE SURGERY     Family History:  History reviewed. No pertinent family history. Family Psychiatric  History: Mom-bipolar disorder Tobacco Screening: Have you used any form of tobacco in the last 30 days? (Cigarettes, Smokeless Tobacco, Cigars, and/or Pipes): No Social History: He was born and raised in [place]. He is close to his parents. He currently lives with his sister, her husband and her 2 kids. He graduated high school. He did some classes at [employer] but had to drop out. He works as a Financial risk analyst at [employer]. He is not in a relationship and has no kids. He states that his mother is very supportive.  Pain Medications: none Prescriptions: none Over the Counter: none History of alcohol / drug use?: Yes Longest period of sobriety (when/how long): unknown Negative Consequences of Use: Personal relationships Withdrawal Symptoms: (none) Name of Substance 1: Marijuana 1 - Amount (size/oz): unknown 1 -  Frequency: daily 1 - Duration: unknown 1 - Last Use / Amount: 01/31/2018 Name of Substance 2: cocaine 2 - Age of First Use: unknow 2 - Amount (size/oz): unknown 2 - Frequency: occasional 2 - Duration: unknown 2 - Last Use / Amount: a few days ago Name of Substance 3: LSD 3 - Age of First Use: unknown 3 - Amount (size/oz): unknown 3 - Frequency: occasionally 3 - Duration: unknown 3 - Last Use / Amount: 01/30/2018 Name of Substance 4: Alcohol 4 - Age of First Use: unknown 4 - Amount (size/oz): one beer 4 - Frequency: "hardly ever" 4 - Duration: unknown 4 - Last Use / Amount: unknown            Allergies:  No Known Allergies Lab Results:  Results for orders placed or performed during the hospital encounter of 02/01/18 (from the past 48 hour(s))  Hemoglobin A1c     Status: None   Collection Time: 02/02/18  7:21 AM  Result Value Ref Range   Hgb A1c MFr Bld 5.0 4.8 - 5.6 %    Comment: (NOTE) Pre diabetes:          5.7%-6.4% Diabetes:              >6.4% Glycemic control for   <7.0% adults with diabetes    Mean  Plasma Glucose 96.8 mg/dL    Comment: Performed at [facility], [address], [place], [place] [address]  Lipid panel     Status: Abnormal   Collection Time: 02/02/18  7:21 AM  Result Value Ref Range   Cholesterol 170 0 - 200 mg/dL   Triglycerides 191 <478 mg/dL   HDL 36 (L) >29 mg/dL   Total CHOL/HDL Ratio 4.7 RATIO   VLDL 22 0 - 40 mg/dL   LDL Cholesterol 562 (H) 0 - 99 mg/dL    Comment:        Total Cholesterol/HDL:CHD Risk Coronary Heart Disease Risk Table                     Men   Women  1/2 Average Risk   3.4   3.3  Average Risk       5.0   4.4  2 X Average Risk   9.6   7.1  3 X Average Risk  23.4   11.0        Use the calculated Patient Ratio above and the CHD Risk Table to determine the patient's CHD Risk.        ATP III CLASSIFICATION (LDL):  <100     mg/dL   Optimal  130-865  mg/dL   Near or Above                    Optimal  130-159  mg/dL   Borderline  784-696  mg/dL   High  >295     mg/dL   Very High Performed at [facility], [address] [place], [place] [address]   TSH     Status: None   Collection Time: 02/02/18  7:21 AM  Result Value Ref Range   TSH 4.020 0.350 - 4.500 uIU/mL    Comment: Performed by a 3rd Generation assay with a functional sensitivity of <=0.01 uIU/mL. Performed at [facility], [address], [place], [place] [address]     Blood Alcohol level:  Lab Results  Component Value Date   [facility] <10 02/01/2018   ETH <10 (H) 07/28/2017  Metabolic Disorder Labs:  Lab Results  Component Value Date   HGBA1C 5.0 02/02/2018   MPG 96.8 02/02/2018   MPG 93.93 07/30/2017   No results found for: PROLACTIN Lab Results  Component Value Date   CHOL 170 02/02/2018   TRIG 108 02/02/2018   HDL 36 (L) 02/02/2018   CHOLHDL 4.7 02/02/2018   VLDL 22 02/02/2018   LDLCALC 112 (H) 02/02/2018   LDLCALC 84 07/30/2017    Current Medications: Current Facility-Administered Medications  Medication Dose Route Frequency  Provider Last Rate Last Dose  . acetaminophen (TYLENOL) tablet 650 mg  650 mg Oral Q6H PRN Clapacs, John T, MD      . alum & mag hydroxide-simeth (MAALOX/MYLANTA) 200-200-20 MG/5ML suspension 30 mL  30 mL Oral Q4H PRN Clapacs, John T, MD      . lithium carbonate capsule 600 mg  600 mg Oral BID WC Clapacs, John T, MD   600 mg at 02/02/18 0901  . magnesium hydroxide (MILK OF MAGNESIA) suspension 30 mL  30 mL Oral Daily PRN Clapacs, John T, MD      . OLANZapine (ZYPREXA) tablet 15 mg  15 mg Oral QHS Sameul Tagle R, MD      . traZODone (DESYREL) tablet 100 mg  100 mg Oral QHS PRN Clapacs, Jackquline Denmark, MD   100 mg at 02/01/18 2153   PTA Medications: Medications Prior to Admission  Medication Sig Dispense Refill Last Dose  . lithium carbonate (LITHOBID) 300 MG CR tablet Take 2 tablets (600 mg total) by mouth every 12 (twelve) hours. (Patient not taking: Reported on 02/01/2018) 120 tablet 1 Not Taking at Unknown time  . OLANZapine (ZYPREXA) 15 MG tablet Take 2 tablets (30 mg total) by mouth at bedtime. (Patient not taking: Reported on 02/01/2018) 60 tablet 1 Not Taking at Unknown time  . traZODone (DESYREL) 100 MG tablet Take 1 tablet (100 mg total) by mouth at bedtime as needed for sleep. (Patient not taking: Reported on 02/01/2018) 30 tablet 1 Not Taking at Unknown time    Musculoskeletal: Strength & Muscle Tone: within normal limits Gait & Station: normal Patient leans: N/A  Psychiatric Specialty Exam: Physical Exam  ROS  Blood pressure 130/74, pulse (!) 126, temperature 98.4 F (36.9 C), temperature source Oral, resp. rate 18, height 5\' 10"  (1.778 m), weight 75.3 kg (166 lb), SpO2 98 %.Body mass index is 23.82 kg/m.  General Appearance: Casual  Eye Contact:  Minimal  Speech:  Slow  Volume:  Decreased  Mood:  Depressed  Affect:  Flat  Thought Process: thought blocking  Orientation:  Full (Time, Place, and Person)  Thought Content:  Rumination  Suicidal Thoughts:  No  Homicidal Thoughts:  No   Memory:  Immediate;   Poor  Judgement:  Impaired  Insight:  Lacking  Psychomotor Activity:  Decreased  Concentration:  Concentration: Poor  Recall:  Poor  Fund of Knowledge:  Poor  Language:  Poor  Akathisia:  No      Assets:  Resilience  ADL's:  Intact  Cognition:  Impaired,  Mild  Sleep:  Number of Hours: 7.5    Treatment Plan Summary: 22 yo male admitted due to psychosis. He has a similar episode of florid psychosis and Mania in September. He improved on Lithium and Zyprexa but has been noncompliant. Pt is not floridly psychotic but has significantly thought blocking and difficulty concentrating and endorses AH. HE does admit to recent LSD and Cocaine use. He does likely have an underlying primary  psychotic disorder which is greatly exacerbated by drug use. He does not appear manic at all and has been isolating and withdrawn to his room today.   Plan:  Schizoaffective disorder -He was restarted on Zyprexa 30 mg. Will lower this to 15 mg as he has not been on it for several months -Continue Lithium 600 mg BID  EKG QTc 368.  Dispo -He will return to live with his sister on discharge and follow up with [facility]  Observation Level/Precautions:  15 minute checks  Laboratory:  Done in ED  Psychotherapy:    Medications:    Consultations:    Discharge Concerns:    Estimated LOS: 5-7 days  Other:     Physician Treatment Plan for Primary Diagnosis: Schizoaffective disorder (HCC) Long Term Goal(s): Improvement in symptoms so as ready for discharge  Short Term Goals: Ability to identify and develop effective coping behaviors will improve   I certify that inpatient services furnished can reasonably be expected to improve the patient's condition.    Haskell RilingHolly R Taiyana Kissler, MD 4/2/201912:54 PM

## 2018-02-02 NOTE — BHH Counselor (Signed)
Adult Comprehensive Assessment  Patient ID: Darryl Hughes, male   DOB: 15-Oct-1996, 22 y.o.   MRN: [id number]  Information Source: Information source: Patient  Current Stressors:  Educational / Learning stressors: (Pt does not report stressors other than "I'm concerned about all of the bad things in the past.")  Living/Environment/Situation:  Living Arrangements: Parent(mother) Living conditions (as described by patient or guardian): positive situation How long has patient lived in current situation?: pt unable to specify What is atmosphere in current home: Supportive  Family History:  Marital status: Single Are you sexually active?: Yes What is your sexual orientation?: heterosexual Has your sexual activity been affected by drugs, alcohol, medication, or emotional stress?: no Does patient have children?: No  Childhood History:  By whom was/is the patient raised?: Both parents Additional childhood history information: Parents divorced when pt was young. (unable to give specific age) "I only remember the good things about my childhood." Description of patient's relationship with caregiver when they were a child: Good relationships with both parents. Patient's description of current relationship with people who raised him/her: Good relationship with both parents.  Father not in the home, pt reports he sees his father frequently. How were you disciplined when you got in trouble as a child/adolescent?: appropriate discipline Does patient have siblings?: Yes Number of Siblings: 5 Description of patient's current relationship with siblings: 2 brothers, 3 sisters.  Pt reports he gets along with his siblings "when I see them."  All live in state of [place]. Did patient suffer any verbal/emotional/physical/sexual abuse as a child?: No Did patient suffer from severe childhood neglect?: No Has patient ever been sexually abused/assaulted/raped as an adolescent or adult?: No Was the patient ever a  victim of a crime or a disaster?: No Witnessed domestic violence?: No Has patient been effected by domestic violence as an adult?: No  Education:  Highest grade of school patient has completed: HS graduate, less than one year of college at [employer]. Currently a student?: No Learning disability?: (unknown)  Employment/Work Situation:   Employment situation: Unemployed Patient's job has been impacted by current illness: Yes Describe how patient's job has been impacted: unable to hold job What is the longest time patient has a held a job?: 6 months? Where was the patient employed at that time?: [employer] Has patient ever been in the [employer]?: No Are There Guns or Other Weapons in Your Home?: No  Financial Resources:   Financial resources: No income Does patient have a Lawyer or guardian?: No  Alcohol/Substance Abuse:   What has been your use of drugs/alcohol within the last 12 months?: alcohol: "when I'm with my friends"  "I drink a lot."  Drugs: Marijuana, Acid.  MDMA.  Pt unable to give specifics of use patterns. If attempted suicide, did drugs/alcohol play a role in this?: No Alcohol/Substance Abuse Treatment Hx: Denies past history Has alcohol/substance abuse ever caused legal problems?: No  Social Support System:   Patient's Community Support System: Good Describe Community Support System: mother, father, siblings Type of faith/religion: I used to go to church How does patient's faith help to cope with current illness?: Pt unable to answer  Leisure/Recreation:   Leisure and Hobbies: sports: tennis, basketball.  Music: vocals.  Strengths/Needs:   What things does the patient do well?: Pt unable to answer In what areas does patient struggle / problems for patient: PT unable to answer  Discharge Plan:   Does patient have access to transportation?: Yes(mother) Will patient be returning to same  living situation after discharge?: Yes(with mother) Currently receiving  community mental health services: No If no, would patient like referral for services when discharged?: Yes (What county?)([place]) Does patient have financial barriers related to discharge medications?: Yes Patient description of barriers related to discharge medications: no insurance  Summary/Recommendations:   Summary and Recommendations (to be completed by the evaluator): Pt is 22 year old male from [place]. Pt is diagnosed with Schizophreniform disorder and was admitted due to new onset psychosis.  Recommendations for pt include crisis stabilization, therapeutic milieu, attend and participate in groups, medication management, and development of comprhensive mental wellness plan.  Darryl Hughes. 02/02/2018

## 2018-02-02 NOTE — Plan of Care (Signed)
  Problem: Activity: Goal: Risk for activity intolerance will decrease Outcome: Progressing   Problem: Elimination: Goal: Will not experience complications related to bowel motility Outcome: Progressing   Problem: Activity: Goal: Will verbalize the importance of balancing activity with adequate rest periods Outcome: Progressing   Problem: Education: Goal: Will be free of psychotic symptoms Outcome: Progressing Goal: Knowledge of the prescribed therapeutic regimen will improve Outcome: Progressing   Problem: Coping: Goal: Coping ability will improve Outcome: Progressing Goal: Will verbalize feelings Outcome: Progressing   Problem: Health Behavior/Discharge Planning: Goal: Compliance with prescribed medication regimen will improve Outcome: Progressing   Problem: Nutritional: Goal: Ability to achieve adequate nutritional intake will improve Outcome: Progressing   Problem: Role Relationship: Goal: Ability to communicate needs accurately will improve Outcome: Progressing Goal: Ability to interact with others will improve Outcome: Progressing   Problem: Self-Care: Goal: Ability to participate in self-care as condition permits will improve Outcome: Progressing   Problem: Self-Concept: Goal: Will verbalize positive feelings about self Outcome: Progressing   Problem: Education: Goal: Knowledge of  General Education information/materials will improve Outcome: Progressing Goal: Emotional status will improve Outcome: Progressing Goal: Mental status will improve Outcome: Progressing   Problem: Activity: Goal: Sleeping patterns will improve Outcome: Progressing   Problem: Health Behavior/Discharge Planning: Goal: Compliance with treatment plan for underlying cause of condition will improve Outcome: Progressing

## 2018-02-02 NOTE — BHH Group Notes (Signed)
  02/02/2018  Time: 0900  Type of Therapy and Topic: Group Therapy: Goals Group: SMART Goals   Participation Level:  Did Not Attend   Description of Group:   The purpose of a daily goals group is to assist and guide patients in setting recovery/wellness-related goals. The objective is to set goals as they relate to the crisis in which they were admitted. Patients will be using SMART goal modalities to set measurable goals. Characteristics of realistic goals will be discussed and patients will be assisted in setting and processing how one will reach their goal. Facilitator will also assist patients in applying interventions and coping skills learned in psycho-education groups to the SMART goal and process how one will achieve defined goal.   Therapeutic Goals:  -Patients will develop and document one goal related to or their crisis in which brought them into treatment.  -Patients will be guided by LCSW using SMART goal setting modality in how to set a measurable, attainable, realistic and time sensitive goal.  -Patients will process barriers in reaching goal.  -Patients will process interventions in how to overcome and successful in reaching goal.   Patient's Goal:  Pt was invited to attend group but chose not to attend. CSW will continue to encourage pt to attend group throughout their admission.   Therapeutic Modalities:  Motivational Interviewing  Cognitive Behavioral Therapy  Crisis Intervention Model  SMART goals setting  Heidi DachKelsey Murray Durrell, MSW, LCSW Clinical Social Worker 02/02/2018 9:47 AM

## 2018-02-02 NOTE — BHH Group Notes (Signed)
02/02/2018 1PM  Type of Therapy/Topic:  Group Therapy:  Feelings about Diagnosis  Participation Level:  Did Not Attend   Description of Group:   This group will allow patients to explore their thoughts and feelings about diagnoses they have received. Patients will be guided to explore their level of understanding and acceptance of these diagnoses. Facilitator will encourage patients to process their thoughts and feelings about the reactions of others to their diagnosis and will guide patients in identifying ways to discuss their diagnosis with significant others in their lives. This group will be process-oriented, with patients participating in exploration of their own experiences, giving and receiving support, and processing challenge from other group members.   Therapeutic Goals: 1. Patient will demonstrate understanding of diagnosis as evidenced by identifying two or more symptoms of the disorder 2. Patient will be able to express two feelings regarding the diagnosis 3. Patient will demonstrate their ability to communicate their needs through discussion and/or role play  Summary of Patient Progress: Patient was encouraged and invited to attend group. Patient did not attend group. Social worker will continue to encourage group participation in the future.        Therapeutic Modalities:   Cognitive Behavioral Therapy Brief Therapy Feelings Identification    Johny ShearsCassandra  Charde Macfarlane, LCSW 02/02/2018 2:00 PM

## 2018-02-02 NOTE — Progress Notes (Signed)
Recreation Therapy Notes  Date: 04.02.2019  Time: 9:30 am  Location: Craft Room  Behavioral response: N/A  Intervention Topic: Communication  Discussion/Intervention: Patient did not attend group.  Clinical Observations/Feedback:  Patient did not attend group.  Darelle Kings LRT/CTRS          Darryl Hughes 02/02/2018 10:50 AM 

## 2018-02-02 NOTE — BHH Counselor (Signed)
Adult Comprehensive Assessment  Patient ID: Darryl Hughes, male   DOB: [birth year], 22 y.o.   MRN: [id number]  Information Source: Information source: Patient  Current Stressors:  Employment: Working but has been calling out of work lately because of his mental health Substance abuse: Alcohol, Acid, Cocaine    Living/Environment/Situation:  Living Arrangements: Sister and her husband  Living conditions (as described by patient or guardian): Good, Loving, supportive How long has patient lived in current situation?: 2-3 Months What is atmosphere in current home: Supportive  Family History:  Marital status: Single Are you sexually active?: Yes What is your sexual orientation?: heterosexual Has your sexual activity been affected by drugs, alcohol, medication, or emotional stress?: no Does patient have children?: No  Childhood History:  By whom was/is the patient raised?: Both parents Additional childhood history information: Parents divorced when pt was young. (Unable to give specific age) "I only remember the good things about my childhood." Description of patient's relationship with caregiver when they were a child: Good relationships with both parents. Patient's description of current relationship with people who raised him/her: Good relationship with both parents.  Father not in the home, pt reports he sees his father frequently. How were you disciplined when you got in trouble as a child/adolescent?: appropriate discipline Does patient have siblings?: Yes Number of Siblings: 5 Description of patient's current relationship with siblings: 2 brothers, 3 sisters.  Pt reports he gets along with his siblings "when I see them."  All live in state of [place]. Did patient suffer any verbal/emotional/physical/sexual abuse as a child?: No Did patient suffer from severe childhood neglect?: No Has patient ever been sexually abused/assaulted/raped as an adolescent or adult?: No Was the patient ever  a victim of a crime or a disaster?: No Witnessed domestic violence?: No Has patient been effected by domestic violence as an adult?: No  Education:  Highest grade of school patient has completed: HS graduate, less than one year of college at [employer]. Currently a student?: No Learning disability?: No  Employment/Work Situation:   Employment situation: Employed- [employer], 3-4 months Patient's job has been impacted by current illness: Yes Describe how patient's job has been impacted: Missing days What is the longest time patient has a held a job?: 6 months? Where was the patient employed at that time?: [employer] Has patient ever been in the [employer]?: No Are There Guns or Other Weapons in Your Home?: Yes, sisters husband has a gun but it is secured in a safe.   Financial Resources:   Financial resources: Employment, support from family. Does patient have a representative payee or guardian?: No  Alcohol/Substance Abuse:   What has been your use of drugs/alcohol within the last 12 months?: Marijuana, Acid, MDMA, Alcohol If attempted suicide, did drugs/alcohol play a role in this?: No- has had thoughts Alcohol/Substance Abuse Treatment Hx: Yes, inpatient Has alcohol/substance abuse ever caused legal problems?: No  Social Support System:   Patient's Community Support System: Good Describe Community Support System: mother, father, siblings Type of faith/religion: I used to go to church How does patient's faith help to cope with current illness?: It doesn't.   Leisure/Recreation:   Leisure and Hobbies: sports: tennis, basketball.  Music: vocals.  Strengths/Needs:   What things does the patient do well?: Sports and cooking.  In what areas does patient struggle / problems for patient: Being happy, being social,   Discharge Plan:   Does patient have access to transportation?: Yes, drove self here. Will patient be  returning to same living situation after discharge?: Yes with sister.   Currently receiving community mental health services: No If no, would patient like referral for services when discharged?: Yes (What county?)([place]) Does patient have financial barriers related to discharge medications?: Yes Patient description of barriers related to discharge medications: no insurance Summary/Recommendations:  Patient is a 22 year old Caucasian male admitted involuntarily with a history of psychotic disorder. Patient lives in [place] with his sister and her husband. Patient reports using alcohol, THC, Cocaine, Acid and MDMA.  His UDS was positive for cocaine and THC. His affect was congruent. At discharge, patient will return back home with his sister and follow up [facility]. While here, patient will benefit from crisis stabilization, medication evaluation, group therapy and psychoeducation, in addition to case management for discharge planning. At discharge, it is recommended that patient remain compliant with the established discharge plan and continue treatment.     Darryl Shearsassandra  Ameka Hughes. 02/02/2018

## 2018-02-03 MED ORDER — OLANZAPINE 10 MG PO TABS
20.0000 mg | ORAL_TABLET | Freq: Every day | ORAL | Status: DC
Start: 2018-02-03 — End: 2018-02-05
  Administered 2018-02-03 – 2018-02-04 (×2): 20 mg via ORAL
  Filled 2018-02-03 (×2): qty 2

## 2018-02-03 NOTE — Plan of Care (Addendum)
Patient found lying in bed awake in the dark upon my arrival. Patient is neither visible nor social this evening. Assessment is difficult as patient's speech is fragmented and patient is disorganized and blocking. Thought processes are impaired significantly. Patient is no longer laughing inappropriately but is more sullen and anxious. Patient states, "I'm starting to second guess myself." This with no explanation or context. Patient statements are only parts of sentences then he trails off. Patient remains isolative to his bed throughout the shift. Denies SI/HI/AVH but unable to verbalize his mood. Encouraged patient to join peers in day room but he declined. Reports poor appetite. Voiding adequately. Compliant with HS medications and staff direction. Given Trazodone for sleep with positive results. Q 15 minute checks maintained. Will continue to monitor throughout the shift.  Patient slept 7.5 hours. No apparent distress. Will endorse care to oncoming shift.  Problem: Elimination: Goal: Will not experience complications related to bowel motility Outcome: Progressing   Problem: Education: Goal: Will be free of psychotic symptoms Outcome: Progressing Goal: Knowledge of the prescribed therapeutic regimen will improve Outcome: Progressing   Problem: Coping: Goal: Coping ability will improve Outcome: Progressing Goal: Will verbalize feelings Outcome: Progressing   Problem: Role Relationship: Goal: Ability to communicate needs accurately will improve Outcome: Not Progressing Goal: Ability to interact with others will improve Outcome: Not Progressing   Problem: Education: Goal: Emotional status will improve Outcome: Not Progressing Goal: Mental status will improve Outcome: Not Progressing

## 2018-02-03 NOTE — Plan of Care (Addendum)
Patient found in bed awake in the dark. Remains neither visible nor social again this evening. Continues to report poor appetite. Patient is unable to process information. Patient is disorganized and thought blocking. Patient unable to finish a sentence or express his feelings. Denies pain. Denies SI/HI/AVH. Compliant with HS medications. Remains isolative to bed throughout the evening. Given Trazodone for sleep with positive results. Appears to be sleeping and in no apparent distress @2300 . Q 15 minute checks maintained. Will continue to monitor throughout the shift. Patient slept 8 hours. No apparent distress. Will endorse care to oncoming shift.  Problem: Elimination: Goal: Will not experience complications related to bowel motility Outcome: Progressing   Problem: Health Behavior/Discharge Planning: Goal: Compliance with prescribed medication regimen will improve Outcome: Progressing   Problem: Activity: Goal: Will verbalize the importance of balancing activity with adequate rest periods Outcome: Not Progressing   Problem: Education: Goal: Will be free of psychotic symptoms Outcome: Not Progressing Goal: Knowledge of the prescribed therapeutic regimen will improve Outcome: Not Progressing   Problem: Coping: Goal: Coping ability will improve Outcome: Not Progressing Goal: Will verbalize feelings Outcome: Not Progressing   Problem: Nutritional: Goal: Ability to achieve adequate nutritional intake will improve Outcome: Not Progressing   Problem: Education: Goal: Emotional status will improve Outcome: Not Progressing Goal: Mental status will improve Outcome: Not Progressing

## 2018-02-03 NOTE — Plan of Care (Addendum)
Patient is alert and oriented, denies SI, HI and AVH. Patient has a pleasant affect, but poor concentration and apprehensive/ guarded when talking with staff. Patient rates depression today 8/10 and 8/10 of anxiety.Patient attends group and expressed gratitude today for staff helping him with treatment. Patient is compliant with treatment and medication regimen. Nurse will continue to monitor.Safety checks Q 15 minutes will continue. Problem: Activity: Goal: Risk for activity intolerance will decrease Outcome: Progressing   Problem: Elimination: Goal: Will not experience complications related to bowel motility Outcome: Progressing   Problem: Education: Goal: Will be free of psychotic symptoms Outcome: Not Progressing Goal: Knowledge of the prescribed therapeutic regimen will improve Outcome: Progressing   Problem: Health Behavior/Discharge Planning: Goal: Compliance with prescribed medication regimen will improve Outcome: Progressing

## 2018-02-03 NOTE — Progress Notes (Signed)
[facility] MD Progress Note  02/03/2018 10:44 AM Darryl Hughes  MRN:  [id number] Subjective:  Pt was in group this morning. HE states that he is starting to feel better today. He states taht his mood is "[place]." He feels he is thinking much more clear and feeling more like himself. He denies SI or thoughts of self harm. He states, "That Lithium really helps." He does still have significant thought blocking and issues with finishing sentences. He is very vague with responses. Affect does appear brighter than yesterday and able to answer questions slightly more readily than yesterday.   Principal Problem: Schizoaffective disorder (HCC) Diagnosis:   Patient Active Problem List   Diagnosis Date Noted  . Schizoaffective disorder (HCC) [F25.9] 02/02/2018    Priority: High  . Cocaine abuse (HCC) [F14.10] 02/01/2018  . 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 Cannabis use disorder, mild, abuse [F12.10] 07/28/2017   Total Time spent with patient: 20 minutes  Past Psychiatric History: See H&P  Past Medical History:  Past Medical History:  Diagnosis Date  . Bipolar disorder (HCC)   . 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 Depression     Past Surgical History:  Procedure Laterality Date  . EYE SURGERY     Family History: History reviewed. No pertinent family history. Family Psychiatric  History: See H&P Social History:  Social History   Substance and Sexual Activity  Alcohol Use Yes   Comment: drinks one beer less than monthly     Social History   Substance and Sexual Activity  Drug Use Yes  . Types: Marijuana, LSD, Cocaine   Comment: marijuana daily, cocaine and LSD occasional    Social History   Socioeconomic History  . Marital status: Single    Spouse name: Not on file  . Number of children: Not on file  . Years of education: Not on file  . Highest education level: Not on file  Occupational History  . Not on file  Social  Needs  . Financial resource strain: Not on file  . Food insecurity:    Worry: Not on file    Inability: Not on file  . Transportation needs:    Medical: Not on file    Non-medical: Not on file  Tobacco Use  . Smoking status: Never Smoker  . Smokeless tobacco: Never Used  Substance and Sexual Activity  . Alcohol use: Yes    Comment: drinks one beer less than monthly  . Drug use: Yes    Types: Marijuana, LSD, Cocaine    Comment: marijuana daily, cocaine and LSD occasional  . Sexual activity: Not Currently  Lifestyle  . Physical activity:    Days per week: Not on file    Minutes per session: Not on file  . Stress: Not on file  Relationships  . Social connections:    Talks on phone: Not on file    Gets together: Not on file    Attends religious service: Not on file    Active member of club or organization: Not on file    Attends meetings of clubs or organizations: Not on file    Relationship status: Not on file  Other Topics Concern  . Not on file  Social History Narrative  . Not on file   Additional Social History:    Pain Medications: none Prescriptions: none Over the Counter: none History of alcohol / drug use?: Yes Longest period of sobriety (when/how  long): unknown Negative Consequences of Use: Personal relationships Withdrawal Symptoms: (none) Name of Substance 1: Marijuana 1 - Amount (size/oz): unknown 1 - Frequency: daily 1 - Duration: unknown 1 - Last Use / Amount: 01/31/2018 Name of Substance 2: cocaine 2 - Age of First Use: unknow 2 - Amount (size/oz): unknown 2 - Frequency: occasional 2 - Duration: unknown 2 - Last Use / Amount: a few days ago Name of Substance 3: LSD 3 - Age of First Use: unknown 3 - Amount (size/oz): unknown 3 - Frequency: occasionally 3 - Duration: unknown 3 - Last Use / Amount: 01/30/2018 Name of Substance 4: Alcohol 4 - Age of First Use: unknown 4 - Amount (size/oz): one beer 4 - Frequency: "hardly ever" 4 - Duration:  unknown 4 - Last Use / Amount: unknown            Sleep: Good  Appetite:  Good  Current Medications: Current Facility-Administered Medications  Medication Dose Route Frequency Provider Last Rate Last Dose  . acetaminophen (TYLENOL) tablet 650 mg  650 mg Oral Q6H PRN Clapacs, John T, MD      . alum & mag hydroxide-simeth (MAALOX/MYLANTA) 200-200-20 MG/5ML suspension 30 mL  30 mL Oral Q4H PRN Clapacs, John T, MD      . lithium carbonate capsule 600 mg  600 mg Oral BID WC Clapacs, Jackquline Denmark, MD   600 mg at 02/03/18 0909  . magnesium hydroxide (MILK OF MAGNESIA) suspension 30 mL  30 mL Oral Daily PRN Clapacs, John T, MD      . OLANZapine (ZYPREXA) tablet 20 mg  20 mg Oral QHS McNew, Ileene Hutchinson, MD      . traZODone (DESYREL) tablet 100 mg  100 mg Oral QHS PRN Clapacs, Jackquline Denmark, MD   100 mg at 02/02/18 2133    Lab Results:  Results for orders placed or performed during the hospital encounter of 02/01/18 (from the past 48 hour(s))  Hemoglobin A1c     Status: None   Collection Time: 02/02/18  7:21 AM  Result Value Ref Range   Hgb A1c MFr Bld 5.0 4.8 - 5.6 %    Comment: (NOTE) Pre diabetes:          5.7%-6.4% Diabetes:              >6.4% Glycemic control for   <7.0% adults with diabetes    Mean Plasma Glucose 96.8 mg/dL    Comment: Performed at [facility], [address] [place], [place] [address]  Lipid panel     Status: Abnormal   Collection Time: 02/02/18  7:21 AM  Result Value Ref Range   Cholesterol 170 0 - 200 mg/dL   Triglycerides 604 <540 mg/dL   HDL 36 (L) >98 mg/dL   Total CHOL/HDL Ratio 4.7 RATIO   VLDL 22 0 - 40 mg/dL   LDL Cholesterol 119 (H) 0 - 99 mg/dL    Comment:        Total Cholesterol/HDL:CHD Risk Coronary Heart Disease Risk Table                     Men   Women  1/2 Average Risk   3.4   3.3  Average Risk       5.0   4.4  2 X Average Risk   9.6   7.1  3 X Average Risk  23.4   11.0        Use the calculated Patient Ratio above  and the CHD Risk  Table to determine the patient's CHD Risk.        ATP III CLASSIFICATION (LDL):  <100     mg/dL   Optimal  409-811  mg/dL   Near or Above                    Optimal  130-159  mg/dL   Borderline  914-782  mg/dL   High  >956     mg/dL   Very High Performed at [facility], [address] [place], [place] [address]   TSH     Status: None   Collection Time: 02/02/18  7:21 AM  Result Value Ref Range   TSH 4.020 0.350 - 4.500 uIU/mL    Comment: Performed by a 3rd Generation assay with a functional sensitivity of <=0.01 uIU/mL. Performed at [facility], [address] [place], [place] [address]     Blood Alcohol level:  Lab Results  Component Value Date   ETH <10 02/01/2018   ETH <10 (H) 07/28/2017    Metabolic Disorder Labs: Lab Results  Component Value Date   HGBA1C 5.0 02/02/2018   MPG 96.8 02/02/2018   MPG 93.93 07/30/2017   No results found for: PROLACTIN Lab Results  Component Value Date   CHOL 170 02/02/2018   TRIG 108 02/02/2018   HDL 36 (L) 02/02/2018   CHOLHDL 4.7 02/02/2018   VLDL 22 02/02/2018   LDLCALC 112 (H) 02/02/2018   LDLCALC 84 07/30/2017    Physical Findings: AIMS:  , ,  ,  ,    CIWA:  CIWA-Ar Total: 1 COWS:     Musculoskeletal: Strength & Muscle Tone: within normal limits Gait & Station: normal Patient leans: N/A  Psychiatric Specialty Exam: Physical Exam  Nursing note and vitals reviewed.   Review of Systems  All other systems reviewed and are negative.   Blood pressure (!) 125/91, pulse (!) 104, temperature 98.2 F (36.8 C), temperature source Oral, resp. rate 18, height 5\' 10"  (1.778 m), weight 75.3 kg (166 lb), SpO2 100 %.Body mass index is 23.82 kg/m.  General Appearance: Casual, good hygiene  Eye Contact:  Good  Speech:  Slow  Volume:  Normal  Mood:  Euthymic  Affect:  Appropriate  Thought Process:  Thought blocking  Orientation:  Full (Time, Place, and Person)  Thought Content:  Logical  Suicidal  Thoughts:  No  Homicidal Thoughts:  No  Memory:  Immediate;   Fair  Judgement:  Fair  Insight:  Lacking  Psychomotor Activity:  Normal  Concentration:  Concentration: Poor  Recall:  Poor  Fund of Knowledge:  Fair  Language:  Fair  Akathisia:  No      Assets:  Resilience  ADL's:  Intact  Cognition:  Impaired,  Mild  Sleep:  Number of Hours: 7.5     Treatment Plan Summary: 22 yo male admitted due to psychosis. Pt has less thought blocking today but still present. Concentration is improving slightly. HE is overall organized in conversation but is very brief and abrupt with answers. We discussed in detail about how acid, marijuana and cocaine use can be detrimental to mental health and to abstain from any drug use. He is tolerating medications and feels they are helping.  Plan:  Schizoaffective disorder -Increase Zyprexa to 20 mg qhs. -Continue Lithium 600 mg BID. Will check level on Saturday  Polysubstance abuse -He declines residential. Will strongly encourage SAIOP  Dispo -He will return to  live with his sister and follow up with [facility]  Haskell RilingHolly R McNew, MD 02/03/2018, 10:44 AM

## 2018-02-03 NOTE — Progress Notes (Signed)
Recreation Therapy Notes   Date: 02/03/2018  Time: 9:30 am  Location: Craft Room  Behavioral response: N/A  Intervention Topic: Coping Skills  Discussion/Intervention: Patient did not attend group.   Clinical Observations/Feedback:  Patient did not attend group. Jakyra Kenealy LRT/CTRS            Libbie Bartley 02/03/2018 12:39 PM 

## 2018-02-03 NOTE — BHH Group Notes (Signed)
LCSW Group Therapy Note  02/03/2018 1:00 pm  Type of Therapy/Topic:  Group Therapy:  Emotion Regulation  Participation Level:  Active   Description of Group:    The purpose of this group is to assist patients in learning to regulate negative emotions and experience positive emotions. Patients will be guided to discuss ways in which they have been vulnerable to their negative emotions. These vulnerabilities will be juxtaposed with experiences of positive emotions or situations, and patients will be challenged to use positive emotions to combat negative ones. Special emphasis will be placed on coping with negative emotions in conflict situations, and patients will process healthy conflict resolution skills.  Therapeutic Goals: 1. Patient will identify two positive emotions or experiences to reflect on in order to balance out negative emotions 2. Patient will label two or more emotions that they find the most difficult to experience 3. Patient will demonstrate positive conflict resolution skills through discussion and/or role plays  Summary of Patient Progress:  Darryl Hughes was able to participate in today's group on emotion regulation.  Darryl Hughes shared that the emotion that he experiences that has been the most difficult for him has been anxiousness.  Darryl Hughes shared that that this emotion is what led to him using illicit substances which he did to try to stop it, but realizes that doing drugs has not been helpful for his physical health or mental health.  Darryl Hughes agreed with other group participants that he needs to have more gratitude and allow himself to be helped by others in order to live a sober life and not have to be hospitalized.     Therapeutic Modalities:   Cognitive Behavioral Therapy Feelings Identification Dialectical Behavioral Therapy

## 2018-02-03 NOTE — Progress Notes (Signed)
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)  Frequency of Recreation/Participation: Chief Executive OfficerMonthly  Awareness of Community Resources:  Yes  Community Resources:  Park  Current Use: Yes  If no, Barriers?:    Expressed Interest in [employer] Resource Information:    [place]  Patient Main Form of Transportation: Set designerCar  Patient Strengths:  helping others  Patient Identified Areas of Improvement:  stop over thinking things, be more social and out going  Patient Goal for Hospitalization:  to get better  Current SI (including self-harm):  No  Current HI:  No  Current AVH: No  Staff Intervention Plan: Group Attendance, Collaborate with Interdisciplinary Treatment Team  Consent to Intern Participation: N/A  Merdith Adan 02/03/2018, 2:19 PM

## 2018-02-03 NOTE — Tx Team (Addendum)
Interdisciplinary Treatment and Diagnostic Plan Update  02/03/2018 Time of Session: 11am Darryl Hughes MRN: [id number]  Principal Diagnosis: Schizoaffective disorder [facility](HCC)  Secondary Diagnoses: Principal Problem:   Schizoaffective disorder (HCC) Active Problems:   Cannabis use disorder, mild, abuse   Hallucinogen use   Current Medications:  Current Facility-Administered Medications  Medication Dose Route Frequency Provider Last Rate Last Dose  . acetaminophen (TYLENOL) tablet 650 mg  650 mg Oral Q6H PRN Clapacs, John T, MD      . alum & mag hydroxide-simeth (MAALOX/MYLANTA) 200-200-20 MG/5ML suspension 30 mL  30 mL Oral Q4H PRN Clapacs, John T, MD      . lithium carbonate capsule 600 mg  600 mg Oral BID WC Clapacs, Jackquline DenmarkJohn T, MD   600 mg at 02/03/18 0909  . magnesium hydroxide (MILK OF MAGNESIA) suspension 30 mL  30 mL Oral Daily PRN Clapacs, John T, MD      . OLANZapine (ZYPREXA) tablet 20 mg  20 mg Oral QHS McNew, Holly R, MD      . traZODone (DESYREL) tablet 100 mg  100 mg Oral QHS PRN Clapacs, Jackquline DenmarkJohn T, MD   100 mg at 02/02/18 2133   PTA Medications: Medications Prior to Admission  Medication Sig Dispense Refill Last Dose  . lithium carbonate (LITHOBID) 300 MG CR tablet Take 2 tablets (600 mg total) by mouth every 12 (twelve) hours. (Patient not taking: Reported on 02/01/2018) 120 tablet 1 Not Taking at Unknown time  . OLANZapine (ZYPREXA) 15 MG tablet Take 2 tablets (30 mg total) by mouth at bedtime. (Patient not taking: Reported on 02/01/2018) 60 tablet 1 Not Taking at Unknown time  . traZODone (DESYREL) 100 MG tablet Take 1 tablet (100 mg total) by mouth at bedtime as needed for sleep. (Patient not taking: Reported on 02/01/2018) 30 tablet 1 Not Taking at Unknown time    Patient Stressors: Medication change or noncompliance Substance abuse  Patient Strengths: Active sense of humor Average or above average intelligence Capable of independent living Communication skills Supportive  family/friends  Treatment Modalities: Medication Management, Group therapy, Case management,  1 to 1 session with clinician, Psychoeducation, Recreational therapy.   Physician Treatment Plan for Primary Diagnosis: Schizoaffective disorder (HCC) Long Term Goal(s): Improvement in symptoms so as ready for discharge   Short Term Goals: Ability to identify and develop effective coping behaviors will improve  Medication Management: Evaluate patient's response, side effects, and tolerance of medication regimen.  Therapeutic Interventions: 1 to 1 sessions, Unit Group sessions and Medication administration.  Evaluation of Outcomes: Progressing  Physician Treatment Plan for Secondary Diagnosis: Principal Problem:   Schizoaffective disorder (HCC) Active Problems:   Cannabis use disorder, mild, abuse   Hallucinogen use  Long Term Goal(s): Improvement in symptoms so as ready for discharge   Short Term Goals: Ability to identify and develop effective coping behaviors will improve     Medication Management: Evaluate patient's response, side effects, and tolerance of medication regimen.  Therapeutic Interventions: 1 to 1 sessions, Unit Group sessions and Medication administration.  Evaluation of Outcomes: Progressing   RN Treatment Plan for Primary Diagnosis: Schizoaffective disorder (HCC) Long Term Goal(s): Knowledge of disease and therapeutic regimen to maintain health will improve  Short Term Goals: Ability to remain free from injury will improve, Ability to verbalize frustration and anger appropriately will improve, Ability to disclose and discuss suicidal ideas and Ability to identify and develop effective coping behaviors will improve  Medication Management: RN will administer medications as ordered by  provider, will assess and evaluate patient's response and provide education to patient for prescribed medication. RN will report any adverse and/or side effects to prescribing  provider.  Therapeutic Interventions: 1 on 1 counseling sessions, Psychoeducation, Medication administration, Evaluate responses to treatment, Monitor vital signs and CBGs as ordered, Perform/monitor CIWA, COWS, AIMS and Fall Risk screenings as ordered, Perform wound care treatments as ordered.  Evaluation of Outcomes: Progressing   LCSW Treatment Plan for Primary Diagnosis: Schizoaffective disorder (HCC) Long Term Goal(s): Safe transition to appropriate next level of care at discharge, Engage patient in therapeutic group addressing interpersonal concerns.  Short Term Goals: Increase ability to appropriately verbalize feelings, Facilitate patient progression through stages of change regarding substance use diagnoses and concerns, Identify triggers associated with mental health/substance abuse issues and Increase skills for wellness and recovery  Therapeutic Interventions: Assess for all discharge needs, 1 to 1 time with Social worker, Explore available resources and support systems, Assess for adequacy in community support network, Educate family and significant other(s) on suicide prevention, Complete Psychosocial Assessment, Interpersonal group therapy.  Evaluation of Outcomes: Progressing   Progress in Treatment: Attending groups: No. Participating in groups: No. Taking medication as prescribed: Yes. Toleration medication: Yes. Family/Significant other contact made: Yes, individual(s) contacted:  Darryl Hughes, Mother, [phone] Patient understands diagnosis: Yes. Discussing patient identified problems/goals with staff: Yes. Medical problems stabilized or resolved: Yes. Denies suicidal/homicidal ideation: Yes. Issues/concerns per patient self-inventory: No. Other:   New problem(s) identified: No, Describe:  None  New Short Term/Long Term Goal(s): "To get better."  Discharge Plan or Barriers: To return home and follow up with outpatient [facility] at [facility]  Reason for Continuation  of Hospitalization: Depression Medication stabilization  Estimated Length of Stay: 5-7 days  Recreational Therapy: Patient Stressors: Depression Patient Goal: Patient will identify 3 triggers for depression within 5 recreation therapy group sessions  Attendees: Patient: Darryl Hughes 02/03/2018 11:24 AM  Physician: Corinna Gab, MD 02/03/2018 11:24 AM  Nursing:  02/03/2018 11:24 AM  RN Care Manager: 02/03/2018 11:24 AM  Social Worker: Johny Shears, LCSWA 02/03/2018 11:24 AM  Recreational Therapist: Danella Deis. Dreama Saa, LRT 02/03/2018 11:24 AM  Other: Heidi Dach, LCSW 02/03/2018 11:24 AM  Other: Huey Romans, LCSW 02/03/2018 11:24 AM  Other: 02/03/2018 11:24 AM    Scribe for Treatment Team: Johny Shears, LCSW 02/03/2018 11:24 AM

## 2018-02-04 NOTE — [facility] Group Notes (Signed)
[facility] Group Notes:  (Nursing/MHT/Case Management/Adjunct)  Date:  02/04/2018  Time:  9:40 PM  Type of Therapy:  Group Therapy  Participation Level:  Active  Participation Quality:  Appropriate  Affect:  Appropriate  Cognitive:  Alert  Insight:  Good  Engagement in Group:  Engaged  Modes of Intervention:  Activity  Summary of Progress/Problems:  Darryl NeerJackie L Kateri Balch 02/04/2018, 9:40 PM

## 2018-02-04 NOTE — Progress Notes (Signed)
[facility] MD Progress Note  02/04/2018 12:41 PM Darryl Hughes  MRN:  [id number]   Subjective:  Pt is out of his room most of the day socializing with his roommate and playing cards. He states that he is feeling "much better today." HE states that he was feeling very anxious and depressed yesterday mostly related to being int he hospital but is feeling much better. He denies AH or VH today. He denies SI or HI. He still has some trouble finishing his sentences and with thought blocking but much better than yesterday. He is sleeping better and feels like the medications are helpful "especially the Lithium."   Principal Problem: Schizoaffective disorder (HCC) Diagnosis:   Patient Active Problem List   Diagnosis Date Noted  . Schizoaffective disorder (HCC) [F25.9] 02/02/2018    Priority: High  . Cocaine abuse (HCC) [F14.10] 02/01/2018  . 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 Cannabis use disorder, mild, abuse [F12.10] 07/28/2017   Total Time spent with patient: 20 minutes  Past Psychiatric History: See H&P  Past Medical History:  Past Medical History:  Diagnosis Date  . Bipolar disorder (HCC)   . 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 Depression     Past Surgical History:  Procedure Laterality Date  . EYE SURGERY     Family History: History reviewed. No pertinent family history. Family Psychiatric  History: See H&P Social History:  Social History   Substance and Sexual Activity  Alcohol Use Yes   Comment: drinks one beer less than monthly     Social History   Substance and Sexual Activity  Drug Use Yes  . Types: Marijuana, LSD, Cocaine   Comment: marijuana daily, cocaine and LSD occasional    Social History   Socioeconomic History  . Marital status: Single    Spouse name: Not on file  . Number of children: Not on file  . Years of education: Not on file  . Highest education level: Not on file  Occupational History  .  Not on file  Social Needs  . Financial resource strain: Not on file  . Food insecurity:    Worry: Not on file    Inability: Not on file  . Transportation needs:    Medical: Not on file    Non-medical: Not on file  Tobacco Use  . Smoking status: Never Smoker  . Smokeless tobacco: Never Used  Substance and Sexual Activity  . Alcohol use: Yes    Comment: drinks one beer less than monthly  . Drug use: Yes    Types: Marijuana, LSD, Cocaine    Comment: marijuana daily, cocaine and LSD occasional  . Sexual activity: Not Currently  Lifestyle  . Physical activity:    Days per week: Not on file    Minutes per session: Not on file  . Stress: Not on file  Relationships  . Social connections:    Talks on phone: Not on file    Gets together: Not on file    Attends religious service: Not on file    Active member of club or organization: Not on file    Attends meetings of clubs or organizations: Not on file    Relationship status: Not on file  Other Topics Concern  . Not on file  Social History Narrative  . Not on file   Additional Social History:    Pain Medications: none Prescriptions: none Over the Counter: none History of alcohol /  drug use?: Yes Longest period of sobriety (when/how long): unknown Negative Consequences of Use: Personal relationships Withdrawal Symptoms: (none) Name of Substance 1: Marijuana 1 - Amount (size/oz): unknown 1 - Frequency: daily 1 - Duration: unknown 1 - Last Use / Amount: 01/31/2018 Name of Substance 2: cocaine 2 - Age of First Use: unknow 2 - Amount (size/oz): unknown 2 - Frequency: occasional 2 - Duration: unknown 2 - Last Use / Amount: a few days ago Name of Substance 3: LSD 3 - Age of First Use: unknown 3 - Amount (size/oz): unknown 3 - Frequency: occasionally 3 - Duration: unknown 3 - Last Use / Amount: 01/30/2018 Name of Substance 4: Alcohol 4 - Age of First Use: unknown 4 - Amount (size/oz): one beer 4 - Frequency: "hardly  ever" 4 - Duration: unknown 4 - Last Use / Amount: unknown            Sleep: Fair  Appetite:  Fair  Current Medications: Current Facility-Administered Medications  Medication Dose Route Frequency Provider Last Rate Last Dose  . acetaminophen (TYLENOL) tablet 650 mg  650 mg Oral Q6H PRN Clapacs, John T, MD      . alum & mag hydroxide-simeth (MAALOX/MYLANTA) 200-200-20 MG/5ML suspension 30 mL  30 mL Oral Q4H PRN Clapacs, John T, MD      . lithium carbonate capsule 600 mg  600 mg Oral BID WC Clapacs, Jackquline DenmarkJohn T, MD   600 mg at 02/04/18 0824  . magnesium hydroxide (MILK OF MAGNESIA) suspension 30 mL  30 mL Oral Daily PRN Clapacs, John T, MD      . OLANZapine (ZYPREXA) tablet 20 mg  20 mg Oral QHS Noya Santarelli, Ileene HutchinsonHolly R, MD   20 mg at 02/03/18 2030  . traZODone (DESYREL) tablet 100 mg  100 mg Oral QHS PRN Clapacs, Jackquline DenmarkJohn T, MD   100 mg at 02/03/18 2030    Lab Results: No results found for this or any previous visit (from the past 48 hour(s)).  Blood Alcohol level:  Lab Results  Component Value Date   ETH <10 02/01/2018   ETH <10 (H) 07/28/2017    Metabolic Disorder Labs: Lab Results  Component Value Date   HGBA1C 5.0 02/02/2018   MPG 96.8 02/02/2018   MPG 93.93 07/30/2017   No results found for: PROLACTIN Lab Results  Component Value Date   CHOL 170 02/02/2018   TRIG 108 02/02/2018   HDL 36 (L) 02/02/2018   CHOLHDL 4.7 02/02/2018   VLDL 22 02/02/2018   LDLCALC 112 (H) 02/02/2018   LDLCALC 84 07/30/2017    Physical Findings: AIMS:  , ,  ,  ,    CIWA:  CIWA-Ar Total: 1 COWS:     Musculoskeletal: Strength & Muscle Tone: within normal limits Gait & Station: normal Patient leans: N/A  Psychiatric Specialty Exam: Physical Exam  Nursing note and vitals reviewed.   Review of Systems  All other systems reviewed and are negative.   Blood pressure 121/88, pulse (!) 119, temperature 98.2 F (36.8 C), temperature source Oral, resp. rate 18, height 5\' 10"  (1.778 m), weight 75.3  kg (166 lb), SpO2 100 %.Body mass index is 23.82 kg/m.  General Appearance: Casual  Eye Contact:  Good  Speech:  Clear and Coherent  Volume:  Normal  Mood:  Euthymic  Affect:  Appropriate  Thought Process:  Coherent and Goal Directed  Orientation:  Full (Time, Place, and Person)  Thought Content:  Logical  Suicidal Thoughts:  No  Homicidal Thoughts:  No  Memory:  Immediate;   Fair  Judgement:  Fair  Insight:  Fair  Psychomotor Activity:  Normal  Concentration:  Concentration: Fair  Recall:  [employer] of Knowledge:  Fair  Language:  Fair  Akathisia:  No      Assets:  Resilience  ADL's:  Intact  Cognition:  Impaired,  Mild  Sleep:  Number of Hours: 8     Treatment Plan Summary: 22 yo male admitted due to psychosis. HE is improving with concentration and thought process but still seems slightly confused at times. HE is still very brief with answers and superficial. Likely due to poor concentration.   Plan:  Schizoaffective disorder -Continue Zyprexa 20 mg qhs -Continue Lithium 600 mg BID. Check level on Saturday  Polysubstance abuse -He declines residential. Will strongly encourage [facility]IOP  Dispo -He will return to live with his sister and follow up with [facility]. I attempted to call his mom today to provide update but no answer.    Haskell Riling, MD 02/04/2018, 12:41 PM

## 2018-02-04 NOTE — Progress Notes (Signed)
Recreation Therapy Notes  Date: 02/04/2018  Time: 9:30 am  Location: Craft Room  Behavioral response: Appropriate  Intervention Topic: Team Work  Discussion/Intervention: Group content on today was focused on teamwork. The group identified what teamwork is. Individuals described who is a part of their team. Patients expressed why they thought teamwork is important. The group stated reasons why they thought it was easier to work with a Comptrollersmaller/larger team. Individuals discussed some positives and negatives of working with a team. Patients gave examples of past experiences they had while working with a team. The group participated in the intervention "Story in a bag", patients were in groups and were able to test their skill in a team setting.   Clinical Observations/Feedback:  Patient came to group and described peers, doctors and social work as apart of his team.He stated team work helps uplift others. Individual expressed he looks for his team mates to be optimistic. [name] LRT/CTRS         Darryl Hughes 02/04/2018 11:41 AM

## 2018-02-04 NOTE — [facility] Group Notes (Signed)
02/04/2018  Time: 1PM  Type of Therapy/Topic:  Group Therapy:  Balance in Life  Participation Level:  Active  Description of Group:   This group will address the concept of balance and how it feels and looks when one is unbalanced. Patients will be encouraged to process areas in their lives that are out of balance and identify reasons for remaining unbalanced. Facilitators will guide patients in utilizing problem-solving interventions to address and correct the stressor making their life unbalanced. Understanding and applying boundaries will be explored and addressed for obtaining and maintaining a balanced life. Patients will be encouraged to explore ways to assertively make their unbalanced needs known to significant others in their lives, using other group members and facilitator for support and feedback.  Therapeutic Goals: 1. Patient will identify two or more emotions or situations they have that consume much of in their lives. 2. Patient will identify signs/triggers that life has become out of balance:  3. Patient will identify two ways to set boundaries in order to achieve balance in their lives:  4. Patient will demonstrate ability to communicate their needs through discussion and/or role plays  Summary of Patient Progress: Pt continues to work towards their tx goals but has not yet reached them. Pt was able to appropriately participate in group discussion, and was able to offer support/validation to other group members. Pt reported feeling he devotes too much attention to, "partying and drugs." Pt reported one area of his life he would like to devote more attention to is, "my family and helping the community." Pt reported one way he can achieve a better balance in life is to, "not close myself off."   Therapeutic Modalities:   Cognitive Behavioral Therapy Solution-Focused Therapy Assertiveness Training  Heidi DachKelsey Perry Brucato, MSW, LCSW Clinical Social Worker 02/04/2018 1:56 PM

## 2018-02-04 NOTE — Plan of Care (Signed)
Patient is alert and oriented; denies SI, HI and AVH. Patient affect is pleasant, but will only answer questions in one or two words. Patient is compliant with medication; states his goal for today is to be more active in groups and more social. Patient rating pain 0/10. Patient continues to have poor hygiene,  states " no" when asked if there is anything he would like to talk about to help him on the unit more. Patient will continue to be monitored by nurse and safety checks Q 15 minutes will continue. Problem: Activity: Goal: Risk for activity intolerance will decrease Outcome: Progressing   Problem: Elimination: Goal: Will not experience complications related to bowel motility Outcome: Progressing   Problem: Education: Goal: Will be free of psychotic symptoms Outcome: Not Progressing Goal: Knowledge of the prescribed therapeutic regimen will improve Outcome: Not Progressing   Problem: Coping: Goal: Coping ability will improve Outcome: Not Progressing Goal: Will verbalize feelings Outcome: Not Progressing   Problem: Health Behavior/Discharge Planning: Goal: Compliance with prescribed medication regimen will improve Outcome: Progressing

## 2018-02-04 NOTE — [facility] Group Notes (Signed)
LCSW Group Therapy Note 02/04/2018 9:00 AM  Type of Therapy and Topic:  Group Therapy:  Setting Goals  Participation Level:  Active  Description of Group: In this process group, patients discussed using strengths to work toward goals and address challenges.  Patients identified two positive things about themselves and one goal they were working on.  Patients were given the opportunity to share openly and support each other's plan for self-empowerment.  The group discussed the value of gratitude and were encouraged to have a daily reflection of positive characteristics or circumstances.  Patients were encouraged to identify a plan to utilize their strengths to work on current challenges and goals.  Therapeutic Goals 1. Patient will verbalize personal strengths/positive qualities and relate how these can assist with achieving desired personal goals 2. Patients will verbalize affirmation of peers plans for personal change and goal setting 3. Patients will explore the value of gratitude and positive focus as related to successful achievement of goals 4. Patients will verbalize a plan for regular reinforcement of personal positive qualities and circumstances.  Summary of Patient Progress:  Darryl Hughes was able to actively participate in today's Setting Goals group.  Darryl Hughes appears to have a good understanding of how he can develop his own SMART goals.  Darryl Hughes shared with group participants that his goal while he is in the hospital is to "work on my patience and slowing things down so I won't be so anxious".  Darryl Hughes also shared that his group for when he is discharged is to be more active in his recovery and allow others to help him even when he starts to feel better psychiatrically.  Darryl Hughes shared that he plans to be more consistent in taking his medications and attending appointments.     Therapeutic Modalities Cognitive Behavioral Therapy Motivational Interviewing    Alease FrameSonya S Laurel Harnden, [place]CS[place] 02/04/2018  10:33 AM

## 2018-02-04 NOTE — Plan of Care (Addendum)
Patient found in bed awake upon my arrival. Patient remains in room until after visitation ends and roommate encouraged him to come out and play a game with him. Denies SI/HI/AVH. Denies depression and anxiety. States, "I had a phenomenal day!" Affect is bright and patient is smiling and interacting appropriately with peers and staff. Speech and thought processes are improved. Less blocking noted, less hesitant. Appears to be processing more clearly. Interaction with others seems to bring out the best in him. Reports improvement in appetite. Denies pain. Patient is very attached to his roommate and there is potential for regression when roommate is discharged tomorrow. Upon admission, patient spoke at length about the importance of his friends and he seems very susceptible to peer pressure whether positive or negative. Patient does all substance abuse with "friends." Patient is pleasant and calm throughout our interaction. Compliant with HS medications and staff direction. Given trazodone for sleep. Will monitor for efficacy. Q 15 minute checks maintained. Will continue to monitor throughout the shift. Patient slept 7.75 hours. No apparent distress. Will endorse care to oncoming shift.  Problem: Elimination: Goal: Will not experience complications related to bowel motility Outcome: Progressing   Problem: Activity: Goal: Will verbalize the importance of balancing activity with adequate rest periods Outcome: Progressing   Problem: Coping: Goal: Coping ability will improve Outcome: Progressing Goal: Will verbalize feelings Outcome: Progressing   Problem: Health Behavior/Discharge Planning: Goal: Compliance with prescribed medication regimen will improve Outcome: Progressing   Problem: Nutritional: Goal: Ability to achieve adequate nutritional intake will improve Outcome: Progressing   Problem: Role Relationship: Goal: Ability to communicate needs accurately will improve Outcome:  Progressing Goal: Ability to interact with others will improve Outcome: Progressing   Problem: Education: Goal: Emotional status will improve Outcome: Progressing   Problem: Activity: Goal: Sleeping patterns will improve Outcome: Progressing

## 2018-02-05 ENCOUNTER — Inpatient Hospital Stay: Payer: No Typology Code available for payment source

## 2018-02-05 LAB — LITHIUM LEVEL: LITHIUM LVL: 0.83 mmol/L (ref 0.60–1.20)

## 2018-02-05 MED ORDER — LITHIUM CARBONATE 300 MG PO CAPS
600.0000 mg | ORAL_CAPSULE | Freq: Two times a day (BID) | ORAL | Status: DC
  Administered 2018-02-05 – 2018-02-09 (×7): 600 mg via ORAL
  Filled 2018-02-05 (×8): qty 2

## 2018-02-05 MED ORDER — TRAZODONE HCL 50 MG PO TABS: 50.0000 mg | ORAL_TABLET | Freq: Every evening | ORAL | Status: DC | PRN

## 2018-02-05 MED ORDER — LITHIUM CARBONATE 300 MG PO CAPS: 300.0000 mg | ORAL_CAPSULE | Freq: Two times a day (BID) | ORAL | Status: DC

## 2018-02-05 MED ORDER — RISPERIDONE 1 MG PO TABS
1.0000 mg | ORAL_TABLET | Freq: Two times a day (BID) | ORAL | Status: DC
  Administered 2018-02-05 – 2018-02-09 (×8): 1 mg via ORAL
  Filled 2018-02-05 (×8): qty 1

## 2018-02-05 MED ORDER — TEMAZEPAM 15 MG PO CAPS
15.0000 mg | ORAL_CAPSULE | Freq: Every evening | ORAL | Status: DC | PRN
  Administered 2018-02-08: 15 mg via ORAL
  Filled 2018-02-05: qty 1

## 2018-02-05 NOTE — Plan of Care (Signed)
No psychotic symptoms observed this evening. Pt. Verbalizes understanding of education provided. Pt. Compliant with medications. Pt. Reports eating good. Pt. Reports show sleeping good. Pt. Denies SI/HI and states he can remain safe while on the unit.    Problem: Education: Goal: Will be free of psychotic symptoms Outcome: Progressing Goal: Knowledge of the prescribed therapeutic regimen will improve Outcome: Progressing   Problem: Health Behavior/Discharge Planning: Goal: Compliance with prescribed medication regimen will improve Outcome: Progressing   Problem: Nutritional: Goal: Ability to achieve adequate nutritional intake will improve Outcome: Progressing   Problem: Education: Goal: Knowledge of [facility] [place] Education information/materials will improve Outcome: Progressing   Problem: Activity: Goal: Sleeping patterns will improve Outcome: Progressing   Problem: Health Behavior/Discharge Planning: Goal: Compliance with treatment plan for underlying cause of condition will improve Outcome: Progressing

## 2018-02-05 NOTE — Progress Notes (Signed)
Recreation Therapy Notes  Date: 02/05/2018  Time: 9:30 am  Location: Craft Room  Behavioral response: Appropriate  Intervention Topic: Leisure  Discussion/Intervention: Group content today was focused on leisure. The group defined what leisure is and some positive leisure activities they participate in. Individuals identified the difference between good and bad leisure. Participants expressed how they feel after participating in the leisure of their choice. The group discussed how they go about picking a leisure activity and if others are involved in their leisure activities. The patient stated how many leisure activities they too choose from and reasons why it is important to have leisure time. Individuals participated in the intervention "Leisure Jeopardy" where they had a chance to identify new leisure activities as well as benefits of leisure. Clinical Observations/Feedback:  Patient came to group and defined leisure as playing sports like tennis and soccer. Individual participated in the intervention and was social with peers and staff during group.  Aliyanah Rozas LRT/CTRS         Bridget Westbrooks 02/05/2018 12:08 PM

## 2018-02-05 NOTE — Progress Notes (Signed)
[facility]  02/05/2018 9:11 AM Darryl Hughes  MRN:  [id number] Subjective:  Pt states that he is not feeling well this morning. He states that he woke up feeling very confused and irritated. HE feels that he is having a lot of side effects from the Zyprexa. He isn't able to quite explain what kind of symptoms he is having. He states, "I had some kind of hiccup this morning. I felt really confused." He states that he woke up feeling very jittery and shaky. He has a very slight tremor. He adamantly denies SI or thoughts of self harm. Denies HI or AH. He does have thought blocking and confusion today, more so than yesterday.   Principal Problem: Schizoaffective disorder (HCC) Diagnosis:   Patient Active Problem List   Diagnosis Date Noted  . Schizoaffective disorder (HCC) [F25.9] 02/02/2018    Priority: High  . Cocaine abuse (HCC) [F14.10] 02/01/2018  . 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 Cannabis use disorder, mild, abuse [F12.10] 07/28/2017   Total Time spent with patient: 20 minutes  Past Psychiatric History: See H&P  Past Medical History:  Past Medical History:  Diagnosis Date  . Bipolar disorder (HCC)   . 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 Depression     Past Surgical History:  Procedure Laterality Date  . EYE SURGERY     Family History: History reviewed. No pertinent family history. Family Psychiatric  History: See H&P Social History:  Social History   Substance and Sexual Activity  Alcohol Use Yes   Comment: drinks one beer less than monthly     Social History   Substance and Sexual Activity  Drug Use Yes  . Types: Marijuana, LSD, Cocaine   Comment: marijuana daily, cocaine and LSD occasional    Social History   Socioeconomic History  . Marital status: Single    Spouse name: Not on file  . Number of children: Not on file  . Years of education: Not on file  . Highest education level: Not on  file  Occupational History  . Not on file  Social Needs  . Financial resource strain: Not on file  . Food insecurity:    Worry: Not on file    Inability: Not on file  . Transportation needs:    Medical: Not on file    Non-medical: Not on file  Tobacco Use  . Smoking status: Never Smoker  . Smokeless tobacco: Never Used  Substance and Sexual Activity  . Alcohol use: Yes    Comment: drinks one beer less than monthly  . Drug use: Yes    Types: Marijuana, LSD, Cocaine    Comment: marijuana daily, cocaine and LSD occasional  . Sexual activity: Not Currently  Lifestyle  . Physical activity:    Days per week: Not on file    Minutes per session: Not on file  . Stress: Not on file  Relationships  . Social connections:    Talks on phone: Not on file    Gets together: Not on file    Attends religious service: Not on file    Active member of club or organization: Not on file    Attends meetings of clubs or organizations: Not on file    Relationship status: Not on file  Other Topics Concern  . Not on file  Social History Narrative  . Not on file   Additional Social History:    Pain  Medications: none Prescriptions: none Over the Counter: none History of alcohol / drug use?: Yes Longest period of sobriety (when/how long): unknown Negative Consequences of Use: Personal relationships Withdrawal Symptoms: (none) Name of Substance 1: Marijuana 1 - Amount (size/oz): unknown 1 - Frequency: daily 1 - Duration: unknown 1 - Last Use / Amount: 01/31/2018 Name of Substance 2: cocaine 2 - Age of First Use: unknow 2 - Amount (size/oz): unknown 2 - Frequency: occasional 2 - Duration: unknown 2 - Last Use / Amount: a few days ago Name of Substance 3: LSD 3 - Age of First Use: unknown 3 - Amount (size/oz): unknown 3 - Frequency: occasionally 3 - Duration: unknown 3 - Last Use / Amount: 01/30/2018 Name of Substance 4: Alcohol 4 - Age of First Use: unknown 4 - Amount (size/oz): one  beer 4 - Frequency: "hardly ever" 4 - Duration: unknown 4 - Last Use / Amount: unknown            Sleep: Good  Appetite:  Good  Current Medications: Current Facility-Administered Medications  Medication Dose Route Frequency Provider Last Rate Last Dose  . acetaminophen (TYLENOL) tablet 650 mg  650 mg Oral Q6H PRN Clapacs, John T, MD      . alum & mag hydroxide-simeth (MAALOX/MYLANTA) 200-200-20 MG/5ML suspension 30 mL  30 mL Oral Q4H PRN Clapacs, John T, MD      . lithium carbonate capsule 300 mg  300 mg Oral BID WC McNew, Holly R, MD      . magnesium hydroxide (MILK OF MAGNESIA) suspension 30 mL  30 mL Oral Daily PRN Clapacs, John T, MD      . risperiDONE (RISPERDAL) tablet 1 mg  1 mg Oral BID McNew, Holly R, MD      . traZODone (DESYREL) tablet 50 mg  50 mg Oral QHS PRN McNew, Ileene Hutchinson, MD        Lab Results: No results found for this or any previous visit (from the past 48 hour(s)).  Blood Alcohol level:  Lab Results  Component Value Date   ETH <10 02/01/2018   ETH <10 (H) 07/28/2017    Metabolic Disorder Labs: Lab Results  Component Value Date   HGBA1C 5.0 02/02/2018   MPG 96.8 02/02/2018   MPG 93.93 07/30/2017   No results found for: PROLACTIN Lab Results  Component Value Date   CHOL 170 02/02/2018   TRIG 108 02/02/2018   HDL 36 (L) 02/02/2018   CHOLHDL 4.7 02/02/2018   VLDL 22 02/02/2018   LDLCALC 112 (H) 02/02/2018   LDLCALC 84 07/30/2017    Physical Findings: AIMS:  , ,  ,  ,    CIWA:  CIWA-Ar Total: 1 COWS:     Musculoskeletal: Strength & Muscle Tone: within normal limits Gait & Station: normal Patient leans: N/A  Psychiatric Specialty Exam: Physical Exam  ROS  Blood pressure (!) 131/95, pulse (!) 103, temperature 98.1 F (36.7 C), temperature source Oral, resp. rate 18, height 5\' 10"  (1.778 m), weight 75.3 kg (166 lb), SpO2 99 %.Body mass index is 23.82 kg/m.  General Appearance: Casual  Eye Contact:  Good  Speech:  stutter  Volume:   Normal  Mood:  Depressed  Affect:  Flat  Thought Process:  Thought blocking  Orientation:  Full (Time, Place, and Person)  Thought Content:  Logical  Suicidal Thoughts:  No  Homicidal Thoughts:  No  Memory:  Immediate;   Fair  Judgement:  Fair  Insight:  Fair  Psychomotor  Activity:  Normal  Concentration:  Concentration: Poor  Recall:  Poor  Fund of Knowledge:  Fair  Language:  Fair  Akathisia:  No      Assets:  Resilience  ADL's:  Intact  Cognition:  Impaired,  Mild  Sleep:  Number of Hours: 7.75     Treatment Plan Summary: 22 yo male admitted due to psychosis. HE is overall organized but does appear very confused with significant thought blocking. He feels very jittery and confused. Will check stat Lithium level this morning and decrease dose for now. He seems to feel that the Zyprexa is causing a lot of side effects so will switch to Risperdal.   Plan:  Schizoaffective disorder =D/C Zyprexa -Start Risperdal 1 mg BID -Decrease Lithium to 300 mg BID. Check Stat level today. Likely to be falsely elevated due to not being trough level -Will check CT head to rule out any organic causes of confusion  Polysubstance abuse -Encourage SAIOP  DIspo -He will return to live with his sister and follow up with [facility]. I attempted to reach his mom again this morning to give update but no answer. Haskell Riling, MD 02/05/2018, 9:11 AM

## 2018-02-05 NOTE — Plan of Care (Signed)
First am patient verbalized that he was not doing well and could not explain why.  As day progressed affect became brighter and verbalized that felt better.  Attending groups.  Up to day room with peers although not interaction noted with peers.  Appetite fair.  Support and encouragement offered.  Safety rounds maintained.   Problem: Activity: Goal: Will verbalize the importance of balancing activity with adequate rest periods Outcome: Progressing   Problem: Education: Goal: Will be free of psychotic symptoms Outcome: Progressing Goal: Knowledge of the prescribed therapeutic regimen will improve Outcome: Progressing   Problem: Coping: Goal: Coping ability will improve Outcome: Progressing Goal: Will verbalize feelings Outcome: Progressing   Problem: Health Behavior/Discharge Planning: Goal: Compliance with prescribed medication regimen will improve Outcome: Progressing   Problem: Nutritional: Goal: Ability to achieve adequate nutritional intake will improve Outcome: Progressing   Problem: Role Relationship: Goal: Ability to communicate needs accurately will improve Outcome: Progressing Goal: Ability to interact with others will improve Outcome: Progressing   Problem: Self-Care: Goal: Ability to participate in self-care as condition permits will improve Outcome: Progressing   Problem: Self-Concept: Goal: Will verbalize positive feelings about self Outcome: Progressing   Problem: Education: Goal: Knowledge of [place] [facility] Education information/materials will improve Outcome: Progressing Goal: Emotional status will improve Outcome: Progressing Goal: Mental status will improve Outcome: Progressing   Problem: Activity: Goal: Sleeping patterns will improve Outcome: Progressing   Problem: Health Behavior/Discharge Planning: Goal: Compliance with treatment plan for underlying cause of condition will improve Outcome: Progressing   Problem: Activity: Goal: Risk  for activity intolerance will decrease Outcome: Completed/Met   Problem: Elimination: Goal: Will not experience complications related to bowel motility Outcome: Completed/Met

## 2018-02-05 NOTE — [facility] Group Notes (Signed)
02/05/2018 1PM  Type of Therapy and Topic:  Group Therapy:  Feelings around Relapse and Recovery  Participation Level:  Active   Description of Group:    Patients in this group will discuss emotions they experience before and after a relapse. They will process how experiencing these feelings, or avoidance of experiencing them, relates to having a relapse. Facilitator will guide patients to explore emotions they have related to recovery. Patients will be encouraged to process which emotions are more powerful. They will be guided to discuss the emotional reaction significant others in their lives may have to patients' relapse or recovery. Patients will be assisted in exploring ways to respond to the emotions of others without this contributing to a relapse.  Therapeutic Goals: 1. Patient will identify two or more emotions that lead to a relapse for them 2. Patient will identify two emotions that result when they relapse 3. Patient will identify two emotions related to recovery 4. Patient will demonstrate ability to communicate their needs through discussion and/or role plays   Summary of Patient Progress: Actively and appropriately engaged in the group. Patient was able to provide support and validation to other group members.Patient practiced active listening when interacting with the facilitator and other group members Patient in still in the process of obtaining treatment goals. Darryl Hughes says "I like to play video games to help me cope. I also know that I can always go to my mother. Recovery to me means feeling clean. When I use I feel dirty and when I don't use I feel clean."    Therapeutic Modalities:   Cognitive Behavioral Therapy Solution-Focused Therapy Assertiveness Training Relapse Prevention Therapy   Johny ShearsCassandra  Reshonda Koerber, LCSW 02/05/2018 1:56 PM

## 2018-02-05 NOTE — Progress Notes (Signed)
D:Pt denies SI/HI/AVH. Pt is pleasant and cooperative, but isolative this evening. Pt. has no Complaints, states he just wants to rest.  Patient Interaction minimal, But engages approprietly and with good substance. Pt. Presents with good insight this evening about why he is here and states he understands it is more important to get the help he needs, before he returns to work.    A: Q x 15 minute observation checks were completed for safety. Patient was provided with education. Patient was given scheduled medications. Patient  was encourage to attend groups, participate in unit activities and continue with plan of care.   R:Patient is complaint with medication and unit procedures. Pt. Attends groups.              Precautionary checks every 15 minutes for safety maintained, room free of safety hazards, patient sustains no injury or falls during this shift.

## 2018-02-06 DIAGNOSIS — F1690 Hallucinogen use, unspecified, uncomplicated: Secondary | ICD-10-CM

## 2018-02-06 DIAGNOSIS — F1210 Cannabis abuse, uncomplicated: Secondary | ICD-10-CM

## 2018-02-06 NOTE — [facility] Group Notes (Signed)
LCSW Group Therapy Note  02/06/2018 1:15pm  Type of Therapy and Topic:  Group Therapy:  Fears and Unhealthy Coping Skills  Participation Level:  Active   Description of Group:  The focus of this group was to discuss some of the prevalent fears that patients experience, and to identify the commonalities among group members.  An exercise was used to initiate the discussion, followed by writing on the white board a group-generated list of unhealthy coping and healthy coping techniques to deal with each fear.    Therapeutic Goals: 1. Patient will identify and describe 3 fears they experience 2. Patient will identify one positive coping strategy for each fear they experience 3. Patient will respond empathically to peers statements regarding fears they experience  Summary of Patient Progress:  Patient scored his mood 8 (10 best). He stated that he is the oldest siblings of triplets. The patient expressed that his fear/problem is "loneliness", an unhealthy coping skills is isolating, a healthy coping skill he would like to go back to is sports. The patient responded empathically to his peers's statements regarding fears they experience.      Therapeutic Modalities Cognitive Behavioral Therapy Motivational Interviewing  Rachel Samples  CUEBAS-COLON, LCSW 02/06/2018 12:55 PM

## 2018-02-06 NOTE — Plan of Care (Signed)
Denies SI/HI/AVH.  Remains safe on the unit.   Problem: Activity: Goal: Will verbalize the importance of balancing activity with adequate rest periods Outcome: Progressing   Problem: Education: Goal: Will be free of psychotic symptoms Outcome: Progressing Goal: Knowledge of the prescribed therapeutic regimen will improve Outcome: Progressing   Problem: Coping: Goal: Coping ability will improve Outcome: Progressing Goal: Will verbalize feelings Outcome: Progressing   Problem: Health Behavior/Discharge Planning: Goal: Compliance with prescribed medication regimen will improve Outcome: Progressing   Problem: Nutritional: Goal: Ability to achieve adequate nutritional intake will improve Outcome: Progressing   Problem: Role Relationship: Goal: Ability to communicate needs accurately will improve Outcome: Progressing Goal: Ability to interact with others will improve Outcome: Progressing   Problem: Self-Care: Goal: Ability to participate in self-care as condition permits will improve Outcome: Progressing   Problem: Self-Concept: Goal: Will verbalize positive feelings about self Outcome: Progressing   Problem: Education: Goal: Knowledge of [place] [facility] Education information/materials will improve Outcome: Progressing Goal: Emotional status will improve Outcome: Progressing Goal: Mental status will improve Outcome: Progressing   Problem: Activity: Goal: Sleeping patterns will improve Outcome: Progressing   Problem: Health Behavior/Discharge Planning: Goal: Compliance with treatment plan for underlying cause of condition will improve Outcome: Progressing

## 2018-02-06 NOTE — Progress Notes (Signed)
[facility] MD Progress Note  02/06/2018 6:03 PM Darryl Hughes  MRN:  [id number]     Subjective:  He patient feels much better this morning on the Risperdal compared to Zyprexa. He did not wake up disoriented or confused and thought processes are clear. He has been visible on the unit, interacting appropriately with staff and peers. He has attended some groups and is trying to identify triggers for substance use. He denies any current active or passive suicidal thoughts or psychotic symptoms.no thought blocking today. The patient feels that the LSD is his biggest problem which has led to his psychosis. He does plan to attend substance abuse treatment at [facility] after discharge. He denies any current severe depressive symptoms and feels like his mood has improved. He feels like he struggles with self-confidence and has some "bad friends" but led him to substance He denies any current somatic complaints and is no longer having problems with feeling jittery or shaky.he has been eating fairly well. Per nursing, e slept over 7 hours last night. Blood pressure was elevated but then returned to normal.  Past Psychiatric History: He has had one past hospitalization in September and was diagnosed with Bipolar disorder. He was started on Lithium and Zyprexa. He followed up with [facility] at least one time but not consistently. HE reports a past suicide attempt by cutting self.      Principal Problem: Schizoaffective disorder (HCC) Diagnosis:   Patient Active Problem List   Diagnosis Date Noted  . Schizoaffective disorder (HCC) [F25.9] 02/02/2018  . Cocaine abuse (HCC) [F14.10] 02/01/2018  . 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 Cannabis use disorder, mild, abuse [F12.10] 07/28/2017   Total Time spent with patient: 20 minutes    Past Medical History:  Past Medical History:  Diagnosis Date  . Bipolar disorder (HCC)   . 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  Depression     Past Surgical History:  Procedure Laterality Date  . EYE SURGERY     Family History: History reviewed. No pertinent family history. Family Psychiatric  History: See H&P Social History:  Social History   Substance and Sexual Activity  Alcohol Use Yes   Comment: drinks one beer less than monthly     Social History   Substance and Sexual Activity  Drug Use Yes  . Types: Marijuana, LSD, Cocaine   Comment: marijuana daily, cocaine and LSD occasional    Social History   Socioeconomic History  . Marital status: Single    Spouse name: Not on file  . Number of children: Not on file  . Years of education: Not on file  . Highest education level: Not on file  Occupational History  . Not on file  Social Needs  . Financial resource strain: Not on file  . Food insecurity:    Worry: Not on file    Inability: Not on file  . Transportation needs:    Medical: Not on file    Non-medical: Not on file  Tobacco Use  . Smoking status: Never Smoker  . Smokeless tobacco: Never Used  Substance and Sexual Activity  . Alcohol use: Yes    Comment: drinks one beer less than monthly  . Drug use: Yes    Types: Marijuana, LSD, Cocaine    Comment: marijuana daily, cocaine and LSD occasional  . Sexual activity: Not Currently  Lifestyle  . Physical activity:    Days per week: Not on  file    Minutes per session: Not on file  . Stress: Not on file  Relationships  . Social connections:    Talks on phone: Not on file    Gets together: Not on file    Attends religious service: Not on file    Active member of club or organization: Not on file    Attends meetings of clubs or organizations: Not on file    Relationship status: Not on file  Other Topics Concern  . Not on file  Social History Narrative  . Not on file   Additional Social History:    Pain Medications: none Prescriptions: none Over the Counter: none History of alcohol / drug use?: Yes Longest period of sobriety  (when/how long): unknown Negative Consequences of Use: Personal relationships Withdrawal Symptoms: (none) Name of Substance 1: Marijuana 1 - Amount (size/oz): unknown 1 - Frequency: daily 1 - Duration: unknown 1 - Last Use / Amount: 01/31/2018 Name of Substance 2: cocaine 2 - Age of First Use: unknow 2 - Amount (size/oz): unknown 2 - Frequency: occasional 2 - Duration: unknown 2 - Last Use / Amount: a few days ago Name of Substance 3: LSD 3 - Age of First Use: unknown 3 - Amount (size/oz): unknown 3 - Frequency: occasionally 3 - Duration: unknown 3 - Last Use / Amount: 01/30/2018 Name of Substance 4: Alcohol 4 - Age of First Use: unknown 4 - Amount (size/oz): one beer 4 - Frequency: "hardly ever" 4 - Duration: unknown 4 - Last Use / Amount: unknown            Sleep: Good  Appetite:  Good  Current Medications: Current Facility-Administered Medications  Medication Dose Route Frequency Provider Last Rate Last Dose  . acetaminophen (TYLENOL) tablet 650 mg  650 mg Oral Q6H PRN Clapacs, John T, MD      . alum & mag hydroxide-simeth (MAALOX/MYLANTA) 200-200-20 MG/5ML suspension 30 mL  30 mL Oral Q4H PRN Clapacs, John T, MD      . lithium carbonate capsule 600 mg  600 mg Oral BID WC McNew, Ileene Hutchinson, MD   600 mg at 02/06/18 0856  . magnesium hydroxide (MILK OF MAGNESIA) suspension 30 mL  30 mL Oral Daily PRN Clapacs, John T, MD      . risperiDONE (RISPERDAL) tablet 1 mg  1 mg Oral BID McNew, Ileene Hutchinson, MD   1 mg at 02/06/18 0856  . temazepam (RESTORIL) capsule 15 mg  15 mg Oral QHS PRN McNew, Ileene Hutchinson, MD        Lab Results:  Results for orders placed or performed during the hospital encounter of 02/01/18 (from the past 48 hour(s))  Lithium level     Status: None   Collection Time: 02/05/18  9:30 AM  Result Value Ref Range   Lithium Lvl 0.83 0.60 - 1.20 mmol/L    Comment: Performed at [facility], [address] [place], [place] [address]    Blood Alcohol level:   Lab Results  Component Value Date   ETH <10 02/01/2018   ETH <10 (H) 07/28/2017    Metabolic Disorder Labs: Lab Results  Component Value Date   HGBA1C 5.0 02/02/2018   MPG 96.8 02/02/2018   MPG 93.93 07/30/2017   No results found for: PROLACTIN Lab Results  Component Value Date   CHOL 170 02/02/2018   TRIG 108 02/02/2018   HDL 36 (L) 02/02/2018   CHOLHDL 4.7 02/02/2018   VLDL 22 02/02/2018  LDLCALC 112 (H) 02/02/2018   LDLCALC 84 07/30/2017    Physical Findings: AIMS: Facial and Oral Movements Muscles of Facial Expression: None, normal Lips and Perioral Area: None, normal Jaw: None, normal Tongue: None, normal,Extremity Movements Upper (arms, wrists, hands, fingers): None, normal Lower (legs, knees, ankles, toes): None, normal, Trunk Movements Neck, shoulders, hips: None, normal, Overall Severity Severity of abnormal movements (highest score from questions above): None, normal Incapacitation due to abnormal movements: None, normal Patient's awareness of abnormal movements (rate only patient's report): No Awareness, Dental Status Current problems with teeth and/or dentures?: No Does patient usually wear dentures?: No  CIWA:  CIWA-Ar Total: 1 COWS:     Musculoskeletal: Strength & Muscle Tone: within normal limits Gait & Station: normal Patient leans: N/A  Psychiatric Specialty Exam: Physical Exam   Review of Systems  Constitutional: Negative.   HENT: Negative.   Eyes: Negative.   Respiratory: Negative.   Cardiovascular: Negative.   Gastrointestinal: Negative.   Genitourinary: Negative.   Musculoskeletal: Negative.   Skin: Negative.   Neurological: Negative.   Endo/Heme/Allergies: Negative.     Blood pressure 137/89, pulse (!) 105, temperature 98.4 F (36.9 C), temperature source Oral, resp. rate 16, height 5\' 10"  (1.778 m), weight 75.3 kg (166 lb), SpO2 100 %.Body mass index is 23.82 kg/m.  General Appearance: Casual  Eye Contact:  Good  Speech:   stutters at times  Volume:  Normal  Mood:  "I feel better today"  Affect:  More itneractive  Thought Process:  Logical and goal directed  Orientation:  Full (Time, Place, and Person)  Thought Content:  Logical  Suicidal Thoughts:  No  Homicidal Thoughts:  No  Memory:  Immediate;   Fair  Judgement:  Fair  Insight:  Fair  Psychomotor Activity:  Normal  Concentration:  Concentration: Poor  Recall:  Poor  Fund of Knowledge:  Fair  Language:  Fair  Akathisia:  No      Assets:  Resilience  ADL's:  Intact  Cognition:  Impaired,  Mild  Sleep:  Number of Hours: 7.3     Treatment Plan Summary: 22 yo male admitted due to psychosis. He is overall organized but does appear very confused with significant thought blocking. When he was on Zyprexa. Psychosis and confusion have confused with the Risperdal.   Plan:  Schizoaffective disorder -Zyprexa was discontinued as patient had problems with confusion and disorientation on the medication. -He was then started on Risperdal 1 mg by mouth twice a day which she is tolerating much better. -Lithium has been decreased toto 300 mg BID. Lithium level was 0.83.  -Will check CT head to rule out any organic causes of confusion  Polysubstance abuse -Encourage SAIOP -[facility] -He was advised to abstain from all illicit drugs as they may worsen psychosis and mood symptoms.  DIspo -He will return to live with his sister and follow up with [facility]. Dr Johnella MoloneyMcNew attempted to reach his mother on 02/05/18 to give update but no answer.  Darliss RidgelAarti K Jennfer Gassen, MD 02/06/2018, 6:03 PM

## 2018-02-07 NOTE — Plan of Care (Signed)
No psychotic features observed this evening. Pt. Compliant with medications this evening. Pt. Verbalizes understanding of education provided. Pt. Nutrition reports good. Pt. Sleep reports good. Pt. Denies SI/Hi/AVH. Pt. Contracts for safety. States he can remain safe while on the unit.    Problem: Education: Goal: Will be free of psychotic symptoms Outcome: Progressing Goal: Knowledge of the prescribed therapeutic regimen will improve Outcome: Progressing   Problem: Health Behavior/Discharge Planning: Goal: Compliance with prescribed medication regimen will improve Outcome: Progressing   Problem: Nutritional: Goal: Ability to achieve adequate nutritional intake will improve Outcome: Progressing   Problem: Education: Goal: Knowledge of [facility] Education information/materials will improve Outcome: Progressing   Problem: Activity: Goal: Sleeping patterns will improve Outcome: Progressing   Problem: Health Behavior/Discharge Planning: Goal: Compliance with treatment plan for underlying cause of condition will improve Outcome: Progressing

## 2018-02-07 NOTE — Plan of Care (Signed)
No psychotic symptoms observed this evening. Pt. Verbalizes understanding of education provided. Pt. Compliant with medications. Pt. Reports eating good. Pt. Reports show sleeping good. Pt. Denies SI/HI and states he can remain safe while on the unit.      Problem: Education: Goal: Will be free of psychotic symptoms Outcome: Progressing Goal: Knowledge of the prescribed therapeutic regimen will improve Outcome: Progressing   Problem: Coping: Goal: Will verbalize feelings Outcome: Progressing   Problem: Health Behavior/Discharge Planning: Goal: Compliance with prescribed medication regimen will improve Outcome: Progressing   Problem: Nutritional: Goal: Ability to achieve adequate nutritional intake will improve Outcome: Progressing   Problem: Education: Goal: Knowledge of [place] Education information/materials will improve Outcome: Progressing   Problem: Activity: Goal: Sleeping patterns will improve Outcome: Progressing   Problem: Health Behavior/Discharge Planning: Goal: Compliance with treatment plan for underlying cause of condition will improve Outcome: Progressing

## 2018-02-07 NOTE — Progress Notes (Signed)
[facility] MD Progress Note  02/07/2018 8:14 AM Darryl Hughes  MRN:  [id number]     Subjective: ]  The patient reports that he is feeling better today and his mood is steadily improving. He does have anxiety related to self confidence and self-esteem and feels like his defect of his left eye contributes to low self-confidence. The patient feels like other people noticed that his left eye is not the same as his right. He has had 7 surgeries on his left eye and has always felt that he is not "good enough". He denies any current active or passive suicidal thoughts or psychotic symptoms. He denies any problems with insomnia and slept over 7 hours last night. Appetite is good. He has been attending groups regularly on the unit and is trying to identify triggers for substance use. He feels like he uses the drugs to cope with low self confidence and low self-esteem. Despite the anxiety, no panic attacks. The patient felt the drugs helped with anxiety. Thought processes are fairly clear today on the Risperdal compared to the Zyprexa. Vital signs are stable.  Cognitive behavioral therapy techniques discussed and time spent helping him to address cognitive distortions including "making assumptions" about others to contributed tolow self-esteem.also spent encaging him to not qualify the positive  Past Psychiatric History: He has had one past hospitalization in September and was diagnosed with Bipolar disorder. He was started on Lithium and Zyprexa. He followed up with [facility] at least one time but not consistently. HE reports a past suicide attempt by cutting self.      Principal Problem: Schizoaffective disorder (HCC) Diagnosis:   Patient Active Problem List   Diagnosis Date Noted  . Schizoaffective disorder (HCC) [F25.9] 02/02/2018  . Cocaine abuse (HCC) [F14.10] 02/01/2018  . 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 Cannabis use disorder, mild, abuse [F12.10] 07/28/2017   Total Time spent with patient:  20 minutes    Past Medical History:  Past Medical History:  Diagnosis Date  . Bipolar disorder (HCC)   . 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 Depression     Past Surgical History:  Procedure Laterality Date  . EYE SURGERY     Family History: History reviewed. No pertinent family history. Family Psychiatric  History: See H&P Social History:  Social History   Substance and Sexual Activity  Alcohol Use Yes   Comment: drinks one beer less than monthly     Social History   Substance and Sexual Activity  Drug Use Yes  . Types: Marijuana, LSD, Cocaine   Comment: marijuana daily, cocaine and LSD occasional    Social History   Socioeconomic History  . Marital status: Single    Spouse name: Not on file  . Number of children: Not on file  . Years of education: Not on file  . Highest education level: Not on file  Occupational History  . Not on file  Social Needs  . Financial resource strain: Not on file  . Food insecurity:    Worry: Not on file    Inability: Not on file  . Transportation needs:    Medical: Not on file    Non-medical: Not on file  Tobacco Use  . Smoking status: Never Smoker  . Smokeless tobacco: Never Used  Substance and Sexual Activity  . Alcohol use: Yes    Comment: drinks one beer less than monthly  . Drug use: Yes  Types: Marijuana, LSD, Cocaine    Comment: marijuana daily, cocaine and LSD occasional  . Sexual activity: Not Currently  Lifestyle  . Physical activity:    Days per week: Not on file    Minutes per session: Not on file  . Stress: Not on file  Relationships  . Social connections:    Talks on phone: Not on file    Gets together: Not on file    Attends religious service: Not on file    Active member of club or organization: Not on file    Attends meetings of clubs or organizations: Not on file    Relationship status: Not on file  Other Topics Concern  . Not on file   Social History Narrative  . Not on file   Additional Social History:    Pain Medications: none Prescriptions: none Over the Counter: none History of alcohol / drug use?: Yes Longest period of sobriety (when/how long): unknown Negative Consequences of Use: Personal relationships Withdrawal Symptoms: (none) Name of Substance 1: Marijuana 1 - Amount (size/oz): unknown 1 - Frequency: daily 1 - Duration: unknown 1 - Last Use / Amount: 01/31/2018 Name of Substance 2: cocaine 2 - Age of First Use: unknow 2 - Amount (size/oz): unknown 2 - Frequency: occasional 2 - Duration: unknown 2 - Last Use / Amount: a few days ago Name of Substance 3: LSD 3 - Age of First Use: unknown 3 - Amount (size/oz): unknown 3 - Frequency: occasionally 3 - Duration: unknown 3 - Last Use / Amount: 01/30/2018 Name of Substance 4: Alcohol 4 - Age of First Use: unknown 4 - Amount (size/oz): one beer 4 - Frequency: "hardly ever" 4 - Duration: unknown 4 - Last Use / Amount: unknown            Sleep: Good  Appetite:  Good  Current Medications: Current Facility-Administered Medications  Medication Dose Route Frequency Provider Last Rate Last Dose  . acetaminophen (TYLENOL) tablet 650 mg  650 mg Oral Q6H PRN Clapacs, John T, MD      . alum & mag hydroxide-simeth (MAALOX/MYLANTA) 200-200-20 MG/5ML suspension 30 mL  30 mL Oral Q4H PRN Clapacs, John T, MD      . lithium carbonate capsule 600 mg  600 mg Oral BID WC McNew, Ileene Hutchinson, MD   600 mg at 02/06/18 0856  . magnesium hydroxide (MILK OF MAGNESIA) suspension 30 mL  30 mL Oral Daily PRN Clapacs, John T, MD      . risperiDONE (RISPERDAL) tablet 1 mg  1 mg Oral BID McNew, Ileene Hutchinson, MD   1 mg at 02/06/18 2124  . temazepam (RESTORIL) capsule 15 mg  15 mg Oral QHS PRN McNew, Ileene Hutchinson, MD        Lab Results:  Results for orders placed or performed during the hospital encounter of 02/01/18 (from the past 48 hour(s))  Lithium level     Status: None    Collection Time: 02/05/18  9:30 AM  Result Value Ref Range   Lithium Lvl 0.83 0.60 - 1.20 mmol/L    Comment: Performed at [facility], [address] [place], [place] [address]    Blood Alcohol level:  Lab Results  Component Value Date   ETH <10 02/01/2018   ETH <10 (H) 07/28/2017    Metabolic Disorder Labs: Lab Results  Component Value Date   HGBA1C 5.0 02/02/2018   MPG 96.8 02/02/2018   MPG 93.93 07/30/2017   No results found for: PROLACTIN  Lab Results  Component Value Date   CHOL 170 02/02/2018   TRIG 108 02/02/2018   HDL 36 (L) 02/02/2018   CHOLHDL 4.7 02/02/2018   VLDL 22 02/02/2018   LDLCALC 112 (H) 02/02/2018   LDLCALC 84 07/30/2017    Physical Findings: AIMS: Facial and Oral Movements Muscles of Facial Expression: None, normal Lips and Perioral Area: None, normal Jaw: None, normal Tongue: None, normal,Extremity Movements Upper (arms, wrists, hands, fingers): None, normal Lower (legs, knees, ankles, toes): None, normal, Trunk Movements Neck, shoulders, hips: None, normal, Overall Severity Severity of abnormal movements (highest score from questions above): None, normal Incapacitation due to abnormal movements: None, normal Patient's awareness of abnormal movements (rate only patient's report): No Awareness, Dental Status Current problems with teeth and/or dentures?: No Does patient usually wear dentures?: No  CIWA:  CIWA-Ar Total: 1 COWS:     Musculoskeletal: Strength & Muscle Tone: within normal limits Gait & Station: normal Patient leans: N/A  Psychiatric Specialty Exam: Physical Exam See H+P  Review of Systems  Constitutional: Negative.   HENT:       Blurry vision in left eye, chronic  Eyes: Negative.   Respiratory: Negative.   Cardiovascular: Negative.   Gastrointestinal: Negative.   Genitourinary: Negative.   Musculoskeletal: Negative.   Skin: Negative.   Neurological: Negative.   Endo/Heme/Allergies: Negative.     Blood  pressure 123/82, pulse (!) 107, temperature 98.2 F (36.8 C), temperature source Oral, resp. rate 18, height 5\' 10"  (1.778 m), weight 75.3 kg (166 lb), SpO2 99 %.Body mass index is 23.82 kg/m.  General Appearance: Casual  Eye Contact:  Good  Speech: Clear, fluent and coherent  Volume:  Normal  Mood:  "OK" today  Affect:  Full and congruent  Thought Process:  Logical and goal directed  Orientation:  Full (Time, Place, and Person)  Thought Content:  Logical  Suicidal Thoughts:  No  Homicidal Thoughts:  No  Memory:  Immediate;   Good Recent;   Good Remote;   Good  Judgement:  Good  Insight:  Good  Psychomotor Activity:  Normal  Concentration:  Concentration: Good and Attention Span: Good  Recall:  Good  Fund of Knowledge:  Good  Language:  Fair  Akathisia:  No      Assets:  Resilience  ADL's:  Intact  Cognition:  Impaired,  Mild  Sleep:  Number of Hours: 7.45     Treatment Plan Summary: 22 yo male admitted due to psychosis. He is overall organized but does appear very confused with significant thought blocking. When he was on Zyprexa. Psychosis and confusion have confused with the Risperdal.   Plan:  Schizoaffective disorder -Zyprexa was discontinued as patient had problems with confusion and disorientation on the medication. -He was then started on Risperdal 1 mg by mouth twice a day which she is tolerating much better. -Lithium has been decreased toto 300 mg BID. Lithium level was 0.83.  -Head CT Head was negative  Polysubstance abuse -Encourage SAIOP after discharge -The patient [facility] -He was advised to abstain from all illicit drugs as they may worsen psychosis and mood symptoms.  DIspo -He will return to live with his sister and follow up with [facility]. Dr Johnella MoloneyMcNew attempted to reach his mother on 02/05/18 to give update but no answer.  Darliss RidgelAarti K Kapur, MD 02/07/2018, 8:14 AM

## 2018-02-07 NOTE — [facility] Group Notes (Signed)
LCSW Group Therapy Note 02/07/2018 1:15pm  Type of Therapy and Topic: Group Therapy: Feelings Around Returning Home & Establishing a Supportive Framework and Supporting Oneself When Supports Not Available  Participation Level: Active  Description of Group:  Patients first processed thoughts and feelings about upcoming discharge. These included fears of upcoming changes, lack of change, new living environments, judgements and expectations from others and overall stigma of mental health issues. The group then discussed the definition of a supportive framework, what that looks and feels like, and how do to discern it from an unhealthy non-supportive network. The group identified different types of supports as well as what to do when your family/friends are less than helpful or unavailable  Therapeutic Goals  1. Patient will identify one healthy supportive network that they can use at discharge. 2. Patient will identify one factor of a supportive framework and how to tell it from an unhealthy network. 3. Patient able to identify one coping skill to use when they do not have positive supports from others. 4. Patient will demonstrate ability to communicate their needs through discussion and/or role plays.  Summary of Patient Progress:  The patient stated that he feels "really good today and probably better if today was tomorrow." Pt engaged during group session. As patients processed their anxiety about discharge and described healthy supports patient shared that he is ready to go home tomorrow. He stated he is more coherent and consistent. Patient indicated he is staying on his medications, reaching out to his support system and going to meetings and [facility].  Patients identified at least one self-care tool they were willing to use after discharge; expressing his feelings.   Therapeutic Modalities Cognitive Behavioral Therapy Motivational Interviewing   Gaston Dase  CUEBAS-COLON, LCSW 02/07/2018 11:50 AM

## 2018-02-07 NOTE — Progress Notes (Signed)
D:Pt denies SI/HI/AVH. Contracts verbally for safety. Reports he can remain safe while on the unit. Pt is pleasant and cooperative, engaging well during assesment. Pt. has no Complaints.  Patient Interaction is cheerful and laughs often, telling jokes. Continues to have good insight. No psychotic features observed this evening. Pt. Nutrition reports good. Pt. Sleep reports good.  A: Q x 15 minute observation checks were completed for safety. Patient was provided with education. Patient was given scheduled medications. Patient  was encourage to attend groups, participate in unit activities and continue with plan of care.   R:Patient is complaint with medication and unit procedures. Attends groups.             Patient slept for Estimated Hours of 7.15; Precautionary checks every 15 minutes for safety maintained, room free of safety hazards, patient sustains no injury or falls during this shift.

## 2018-02-07 NOTE — Plan of Care (Signed)
Patient is alert and oriented X 4, denies SI, HI and AVH. Patient is very animated today and pleasant; patient is attending group and compliant with medications. Patient states he believes the groups are helping him and he is doing much better. Patient is able to verbalize feelings, does not seem to be blocking as he was last week. Patient is taking in adequately nutrition and vitals are within normal limits. Nurse will continue to monitor. Safety checks will continue Q 15 minutes. Problem: Activity: Goal: Will verbalize the importance of balancing activity with adequate rest periods Outcome: Progressing   Problem: Education: Goal: Will be free of psychotic symptoms Outcome: Progressing Goal: Knowledge of the prescribed therapeutic regimen will improve Outcome: Progressing   Problem: Coping: Goal: Coping ability will improve Outcome: Progressing Goal: Will verbalize feelings Outcome: Progressing   Problem: Health Behavior/Discharge Planning: Goal: Compliance with prescribed medication regimen will improve Outcome: Progressing

## 2018-02-07 NOTE — Progress Notes (Signed)
D:Pt denies SI/HI/AVH. Pt is pleasant and cooperative, but heads to bed early and a little isolative. Pt.has no Complaints.Patient Interaction minimal, But engages approprietly and with good substance. Pt. Overall pleasant and in high spirits.   A: Q x 15 minute observation checks were completed for safety. Patient was provided with education. Patient was given scheduled medications. Patient was encourage to attend groups, participate in unit activities and continue with plan of care.   R:Patient is complaint with medication and unit procedures. Pt. Attends groups.              Precautionary checks every 15 minutes for safety maintained, room free of safety hazards, patient sustains no injury or falls during this shift.

## 2018-02-08 MED ORDER — RISPERIDONE 1 MG PO TABS
1.0000 mg | ORAL_TABLET | Freq: Two times a day (BID) | ORAL | 0 refills | Status: DC
Start: 2018-02-08 — End: 2018-02-08

## 2018-02-08 MED ORDER — LITHIUM CARBONATE 600 MG PO CAPS: 600.0000 mg | ORAL_CAPSULE | Freq: Two times a day (BID) | ORAL | 0 refills | Status: DC

## 2018-02-08 MED ORDER — RISPERIDONE 1 MG PO TABS: 1.0000 mg | ORAL_TABLET | Freq: Two times a day (BID) | ORAL | 0 refills | Status: AC

## 2018-02-08 MED ORDER — RISPERIDONE 1 MG PO TABS: 1.0000 mg | ORAL_TABLET | Freq: Two times a day (BID) | ORAL | 0 refills | Status: DC

## 2018-02-08 MED ORDER — LITHIUM CARBONATE 600 MG PO CAPS: 600.0000 mg | ORAL_CAPSULE | Freq: Two times a day (BID) | ORAL | 0 refills | Status: AC

## 2018-02-08 NOTE — Progress Notes (Addendum)
[facility] MD Progress Note  02/08/2018 1:06 PM Darryl Hughes  MRN:  [id number]   Subjective:  Pt states that he is feeling much better. He states that he is so glad that we stopped Zyprexa as he is thinking so much clearer. He feels the Risperdal is working much better for him. Hie thoughts are much clearer today. He does not have thought blocking. He states that his mood is good. He states that he met a lot of friends here that he will keep in touch with. He enjoyed talking with Dr. Nicolasa Ducking about some of his issues that cause him anxiety. We discussed again about abstaining from all drug use including marijuana and how this can be detrimental to his mental health. He states that he needs to find a different group of friends because it has been hard to turn it down when hey are all doing drugs. He is going to meet Lanae Boast with [facility] on Friday to start IOP and is looking forward to this. He is sleeping well. He does not feel jittery or nauseated today.   Principal Problem: Schizoaffective disorder ([place]) Diagnosis:   Patient Active Problem List   Diagnosis Date Noted  . Schizoaffective disorder ([place]) [F25.9] 02/02/2018    Priority: High  . Cocaine abuse ([place]) [F14.10] 02/01/2018  . 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 Cannabis use disorder, mild, abuse [F12.10] 07/28/2017   Total Time spent with patient: 20 minutes  Past Psychiatric History: See H&P  Past Medical History:  Past Medical History:  Diagnosis Date  . Bipolar disorder ([place])   . 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 Depression     Past Surgical History:  Procedure Laterality Date  . EYE SURGERY     Family History: History reviewed. No pertinent family history. Family Psychiatric  History: See H&P Social History:  Social History   Substance and Sexual Activity  Alcohol Use Yes   Comment: drinks one beer less than monthly     Social History   Substance and Sexual  Activity  Drug Use Yes  . Types: Marijuana, LSD, Cocaine   Comment: marijuana daily, cocaine and LSD occasional    Social History   Socioeconomic History  . Marital status: Single    Spouse name: Not on file  . Number of children: Not on file  . Years of education: Not on file  . Highest education level: Not on file  Occupational History  . Not on file  Social Needs  . Financial resource strain: Not on file  . Food insecurity:    Worry: Not on file    Inability: Not on file  . Transportation needs:    Medical: Not on file    Non-medical: Not on file  Tobacco Use  . Smoking status: Never Smoker  . Smokeless tobacco: Never Used  Substance and Sexual Activity  . Alcohol use: Yes    Comment: drinks one beer less than monthly  . Drug use: Yes    Types: Marijuana, LSD, Cocaine    Comment: marijuana daily, cocaine and LSD occasional  . Sexual activity: Not Currently  Lifestyle  . Physical activity:    Days per week: Not on file    Minutes per session: Not on file  . Stress: Not on file  Relationships  . Social connections:    Talks on phone: Not on file    Gets together: Not on file    Attends  religious service: Not on file    Active member of club or organization: Not on file    Attends meetings of clubs or organizations: Not on file    Relationship status: Not on file  Other Topics Concern  . Not on file  Social History Narrative  . Not on file   Additional Social History:    Pain Medications: none Prescriptions: none Over the Counter: none History of alcohol / drug use?: Yes Longest period of sobriety (when/how long): unknown Negative Consequences of Use: Personal relationships Withdrawal Symptoms: (none) Name of Substance 1: Marijuana 1 - Amount (size/oz): unknown 1 - Frequency: daily 1 - Duration: unknown 1 - Last Use / Amount: 01/31/2018 Name of Substance 2: cocaine 2 - Age of First Use: unknow 2 - Amount (size/oz): unknown 2 - Frequency:  occasional 2 - Duration: unknown 2 - Last Use / Amount: a few days ago Name of Substance 3: LSD 3 - Age of First Use: unknown 3 - Amount (size/oz): unknown 3 - Frequency: occasionally 3 - Duration: unknown 3 - Last Use / Amount: 01/30/2018 Name of Substance 4: Alcohol 4 - Age of First Use: unknown 4 - Amount (size/oz): one beer 4 - Frequency: "hardly ever" 4 - Duration: unknown 4 - Last Use / Amount: unknown            Sleep: Good  Appetite:  Good  Current Medications: Current Facility-Administered Medications  Medication Dose Route Frequency Provider Last Rate Last Dose  . acetaminophen (TYLENOL) tablet 650 mg  650 mg Oral Q6H PRN Clapacs, John T, MD      . alum & mag hydroxide-simeth (MAALOX/MYLANTA) 200-200-20 MG/5ML suspension 30 mL  30 mL Oral Q4H PRN Clapacs, John T, MD      . lithium carbonate capsule 600 mg  600 mg Oral BID WC Ariston Grandison, Tyson Babinski, MD   600 mg at 02/08/18 0859  . magnesium hydroxide (MILK OF MAGNESIA) suspension 30 mL  30 mL Oral Daily PRN Clapacs, John T, MD      . risperiDONE (RISPERDAL) tablet 1 mg  1 mg Oral BID Lopez Dentinger, Tyson Babinski, MD   1 mg at 02/08/18 0859  . temazepam (RESTORIL) capsule 15 mg  15 mg Oral QHS PRN Zarrah Loveland, Tyson Babinski, MD        Lab Results: No results found for this or any previous visit (from the past 48 hour(s)).  Blood Alcohol level:  Lab Results  Component Value Date   ETH <10 02/01/2018   ETH <10 (H) 81/19/1478    Metabolic Disorder Labs: Lab Results  Component Value Date   HGBA1C 5.0 02/02/2018   MPG 96.8 02/02/2018   MPG 93.93 07/30/2017   No results found for: PROLACTIN Lab Results  Component Value Date   CHOL 170 02/02/2018   TRIG 108 02/02/2018   HDL 36 (L) 02/02/2018   CHOLHDL 4.7 02/02/2018   VLDL 22 02/02/2018   LDLCALC 112 (H) 02/02/2018   LDLCALC 84 07/30/2017    Physical Findings: AIMS: Facial and Oral Movements Muscles of Facial Expression: None, normal Lips and Perioral Area: None, normal Jaw: None,  normal Tongue: None, normal,Extremity Movements Upper (arms, wrists, hands, fingers): None, normal Lower (legs, knees, ankles, toes): None, normal, Trunk Movements Neck, shoulders, hips: None, normal, Overall Severity Severity of abnormal movements (highest score from questions above): None, normal Incapacitation due to abnormal movements: None, normal Patient's awareness of abnormal movements (rate only patient's report): No Awareness, Dental Status Current problems with teeth  and/or dentures?: No Does patient usually wear dentures?: No  CIWA:  CIWA-Ar Total: 1 COWS:     Musculoskeletal: Strength & Muscle Tone: within normal limits Gait & Station: normal Patient leans: N/A  Psychiatric Specialty Exam: Physical Exam  Nursing note and vitals reviewed.   Review of Systems  All other systems reviewed and are negative.   Blood pressure 123/84, pulse 87, temperature 98.2 F (36.8 C), temperature source Oral, resp. rate 18, height 5' 10"  (1.778 m), weight 75.3 kg (166 lb), SpO2 100 %.Body mass index is 23.82 kg/m.  General Appearance: Casual  Eye Contact:  Good  Speech:  Clear and Coherent  Volume:  Normal  Mood:  Euthymic  Affect:  Appropriate  Thought Process:  Coherent and Goal Directed  Orientation:  Full (Time, Place, and Person)  Thought Content:  Logical  Suicidal Thoughts:  No  Homicidal Thoughts:  No  Memory:  Immediate;   Fair  Judgement:  Fair  Insight:  Fair  Psychomotor Activity:  Normal  Concentration:  Concentration: Fair  Recall:  [employer] of Knowledge:  Fair  Language:  Fair  Akathisia:  No      Assets:  Resilience  ADL's:  Intact  Cognition:  WNL  Sleep:  Number of Hours: 7     Treatment Plan Summary: 22 yo male admitted due to psychosis. He is doing much better today since adjusting medications. Thought blocking and confusion has improved significantly. He is socializing and interacting well with peers on he unit. He is tolerating medications.  He is requesting discharge.   Plan:  Schizoaffective disorder -Continue Risperdal 1 mg BID -Continue Lithium 600 mg BID -Head CT negative  Polysubstance -He will start SAIOP with [facility] on Friday -He was advised to abstain from all substance including marijuana  Dispo -He will return to live with his sister and follow up with [facility]. Likely discharge tomorrow. I attempted to call his mother again today but no answer  Marylin Crosby, MD 02/08/2018, 1:06 PM

## 2018-02-08 NOTE — Plan of Care (Signed)
Patient is alert and oriented.  Bright affect.  Rates depression, anxiety and hopelessness as 0/10.  Denies SI/HI/AVH.  Interacting appropriately with peers and staff. Attending groups. Medication compliant.  Maintaining personal care chores appropriately.  Good appetite.  Support and encouragement offered.  Safety rounds maintained.

## 2018-02-08 NOTE — Progress Notes (Signed)
Recreation Therapy Notes  Date: 02/08/2018  Time: 9:30 am  Location: Craft Room  Behavioral response: Appropriate  Intervention Topic: Creative Expression  Discussion/Intervention: Group content on today was focused on creative expressions. The group defined creative expressions and ways they use creative expressions. Individual identified other positive ways creative expressions can be used and why it is important to express yourself. Patients participated in the intervention "expressive painting", where they had a chance to creatively express themselves.  Clinical Observations/Feedback:  Patient came to group and stated movement is a form of creative expression.Individuals expressed that is important for him to express himself to prevent build up. He participated in the intervention and was social with peers and staff.  Darryl Hughes LRT/CTRS         [name] 02/08/2018 12:18 PM

## 2018-02-08 NOTE — Tx Team (Signed)
Interdisciplinary Treatment and Diagnostic Plan Update  02/08/2018 Time of Session: 11am Darryl Hughes MRN: [id number]  Principal Diagnosis: Schizoaffective disorder Darryl Hughes [facility])  Secondary Diagnoses: Principal Problem:   Schizoaffective disorder (HCC) Active Problems:   Cannabis use disorder, mild, abuse   Hallucinogen use   Current Medications:  Current Facility-Administered Medications  Medication Dose Route Frequency Provider Last Rate Last Dose  . acetaminophen (TYLENOL) tablet 650 mg  650 mg Oral Q6H PRN Hughes, Darryl T, MD      . alum & mag hydroxide-simeth (MAALOX/MYLANTA) 200-200-20 MG/5ML suspension 30 mL  30 mL Oral Q4H PRN Hughes, Darryl T, MD      . lithium carbonate capsule 600 mg  600 mg Oral BID WC McNew, Ileene Hutchinson, MD   600 mg at 02/08/18 0859  . magnesium hydroxide (MILK OF MAGNESIA) suspension 30 mL  30 mL Oral Daily PRN Hughes, Darryl T, MD      . risperiDONE (RISPERDAL) tablet 1 mg  1 mg Oral BID McNew, Ileene Hutchinson, MD   1 mg at 02/08/18 0859  . temazepam (RESTORIL) capsule 15 mg  15 mg Oral QHS PRN McNew, Ileene Hutchinson, MD       PTA Medications: Medications Prior to Admission  Medication Sig Dispense Refill Last Dose  . lithium carbonate (LITHOBID) 300 MG CR tablet Take 2 tablets (600 mg total) by mouth every 12 (twelve) hours. (Patient not taking: Reported on 02/01/2018) 120 tablet 1 Not Taking at Unknown time  . OLANZapine (ZYPREXA) 15 MG tablet Take 2 tablets (30 mg total) by mouth at bedtime. (Patient not taking: Reported on 02/01/2018) 60 tablet 1 Not Taking at Unknown time  . traZODone (DESYREL) 100 MG tablet Take 1 tablet (100 mg total) by mouth at bedtime as needed for sleep. (Patient not taking: Reported on 02/01/2018) 30 tablet 1 Not Taking at Unknown time    Patient Stressors: Medication change or noncompliance Substance abuse  Patient Strengths: Active sense of humor Average or above average intelligence Capable of independent living Communication skills Supportive  family/friends  Treatment Modalities: Medication Management, Group therapy, Case management,  1 to 1 session with clinician, Psychoeducation, Recreational therapy.   Physician Treatment Plan for Primary Diagnosis: Schizoaffective disorder (HCC) Long Term Goal(s): Improvement in symptoms so as ready for discharge   Short Term Goals: Ability to identify and develop effective coping behaviors will improve  Medication Management: Evaluate patient's response, side effects, and tolerance of medication regimen.  Therapeutic Interventions: 1 to 1 sessions, Unit Group sessions and Medication administration.  Evaluation of Outcomes: Progressing  Physician Treatment Plan for Secondary Diagnosis: Principal Problem:   Schizoaffective disorder (HCC) Active Problems:   Cannabis use disorder, mild, abuse   Hallucinogen use  Long Term Goal(s): Improvement in symptoms so as ready for discharge   Short Term Goals: Ability to identify and develop effective coping behaviors will improve     Medication Management: Evaluate patient's response, side effects, and tolerance of medication regimen.  Therapeutic Interventions: 1 to 1 sessions, Unit Group sessions and Medication administration.  Evaluation of Outcomes: Progressing   RN Treatment Plan for Primary Diagnosis: Schizoaffective disorder (HCC) Long Term Goal(s): Knowledge of disease and therapeutic regimen to maintain health will improve  Short Term Goals: Ability to remain free from injury will improve, Ability to verbalize frustration and anger appropriately will improve, Ability to disclose and discuss suicidal ideas and Ability to identify and develop effective coping behaviors will improve  Medication Management: RN will administer medications as ordered by  provider, will assess and evaluate patient's response and provide education to patient for prescribed medication. RN will report any adverse and/or side effects to prescribing  provider.  Therapeutic Interventions: 1 on 1 counseling sessions, Psychoeducation, Medication administration, Evaluate responses to treatment, Monitor vital signs and CBGs as ordered, Perform/monitor CIWA, COWS, AIMS and Fall Risk screenings as ordered, Perform wound care treatments as ordered.  Evaluation of Outcomes: Progressing   LCSW Treatment Plan for Primary Diagnosis: Schizoaffective disorder (HCC) Long Term Goal(s): Safe transition to appropriate next level of care at discharge, Engage patient in therapeutic group addressing interpersonal concerns.  Short Term Goals: Increase ability to appropriately verbalize feelings, Facilitate patient progression through stages of change regarding substance use diagnoses and concerns, Identify triggers associated with mental health/substance abuse issues and Increase skills for wellness and recovery  Therapeutic Interventions: Assess for all discharge needs, 1 to 1 time with Social worker, Explore available resources and support systems, Assess for adequacy in community support network, Educate family and significant other(s) on suicide prevention, Complete Psychosocial Assessment, Interpersonal group therapy.  Evaluation of Outcomes: Progressing   Progress in Treatment: Attending groups: Yes. Participating in groups: Yes. Taking medication as prescribed: Yes. Toleration medication: Yes. Family/Significant other contact made: Yes, individual(s) contacted:  Darryl LaniusRae Hughes, Mother, [phone] Patient understands diagnosis: Yes. Discussing patient identified problems/goals with staff: Yes. Medical problems stabilized or resolved: Yes. Denies suicidal/homicidal ideation: Yes. Issues/concerns per patient self-inventory: No. Other:   New problem(s) identified: No, Describe:  None  New Short Term/Long Term Goal(s): "To get better."  Discharge Plan or Barriers: [facility] return home [facility]  Reason for  Continuation of Hospitalization: Depression Medication stabilization  Estimated Length of Stay: 2 days  Recreational Therapy: Patient Stressors: Depression Patient Goal: Patient will identify 3 triggers for depression within 5 recreation therapy group sessions  Attendees: Patient:  02/08/2018 11:40 AM  Physician: Corinna GabHolly McNew, MD 02/08/2018 11:40 AM  Nursing: Leonia ReaderPhyllis Cobb, RN 02/08/2018 11:40 AM  RN Care Manager: 02/08/2018 11:40 AM  Social Worker: Johny Shearsassandra Vic Esco, LCSWA 02/08/2018 11:40 AM  Recreational Therapist: Danella DeisShay. Outlaw CTRS, LRT 02/08/2018 11:40 AM  Other: Heidi DachKelsey Craig, LCSW 02/08/2018 11:40 AM  Other: Huey RomansSonya Carter, LCSW 02/08/2018 11:40 AM  Other: 02/08/2018 11:40 AM    Scribe for Treatment Team: Johny Shearsassandra  Aviella Disbrow, LCSW 02/08/2018 11:40 AM

## 2018-02-08 NOTE — [facility] Group Notes (Signed)
[facility] Group Notes:  (Nursing/MHT/Case Management/Adjunct)  Date:  02/08/2018  Time:  4:19 PM  Type of Therapy:  Psychoeducational Skills  Participation Level:  Active  Participation Quality:  Appropriate  Affect:  Appropriate  Cognitive:  Appropriate  Insight:  Appropriate  Engagement in Group:  Engaged  Modes of Intervention:  Socialization  Summary of Progress/Problems:  Lynelle SmokeCara Travis Lexie Koehl 02/08/2018, 4:19 PM

## 2018-02-09 NOTE — Discharge Summary (Signed)
Physician Discharge Summary Note  Patient:  Darryl Hughes is an 22 y.o., male MRN:  [id number] DOB:  [birth year] Patient phone:  [phone] (home)  Patient address:   [address] [place] [address],  Total Time spent with patient: 20 minutes  Plus 20 minutes of medication reconciliation, discharge planning, and discharge documentation   Date of Admission:  02/01/2018 Date of Discharge: 02/09/18  Reason for Admission:  Psychosis and confusion  Principal Problem: Schizoaffective disorder [facility]) Discharge Diagnoses: Patient Active Problem List   Diagnosis Date Noted  . Schizoaffective disorder (HCC) [F25.9] 02/02/2018    Priority: High  . Cocaine abuse (HCC) [F14.10] 02/01/2018  . 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 Cannabis use disorder, mild, abuse [F12.10] 07/28/2017    Past Psychiatric History: See h&P  Past Medical History:  Past Medical History:  Diagnosis Date  . Bipolar disorder (HCC)   . 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 Depression     Past Surgical History:  Procedure Laterality Date  . EYE SURGERY     Family History: History reviewed. No pertinent family history. Family Psychiatric  History: See H&P Social History:  Social History   Substance and Sexual Activity  Alcohol Use Yes   Comment: drinks one beer less than monthly     Social History   Substance and Sexual Activity  Drug Use Yes  . Types: Marijuana, LSD, Cocaine   Comment: marijuana daily, cocaine and LSD occasional    Social History   Socioeconomic History  . Marital status: Single    Spouse name: Not on file  . Number of children: Not on file  . Years of education: Not on file  . Highest education level: Not on file  Occupational History  . Not on file  Social Needs  . Financial resource strain: Not on file  . Food insecurity:    Worry: Not on file    Inability: Not on file  . Transportation needs:     Medical: Not on file    Non-medical: Not on file  Tobacco Use  . Smoking status: Never Smoker  . Smokeless tobacco: Never Used  Substance and Sexual Activity  . Alcohol use: Yes    Comment: drinks one beer less than monthly  . Drug use: Yes    Types: Marijuana, LSD, Cocaine    Comment: marijuana daily, cocaine and LSD occasional  . Sexual activity: Not Currently  Lifestyle  . Physical activity:    Days per week: Not on file    Minutes per session: Not on file  . Stress: Not on file  Relationships  . Social connections:    Talks on phone: Not on file    Gets together: Not on file    Attends religious service: Not on file    Active member of club or organization: Not on file    Attends meetings of clubs or organizations: Not on file    Relationship status: Not on file  Other Topics Concern  . Not on file  Social History Narrative  . Not on file    Hospital Course:  Pt was restarted on past medications of Lithium and Zyprexa. He was complaining of worsening confusion and jitteriness after starting these medications. Stat Lithium level (was not trough) was in therapeutic range at 0.83 (on 02/05/18). Zyprexa was discontinued as he strongly felt this was contributing. He was started on Risperdal. CT head also done to  rule out any organic causes of confusion and was negative. He responded very well to Risperdal and Lithium combination. By day of discharge, affect was much brighter and less anxious. His thought process was much clearer and thought blocking was no longer present. He was very social with peers on the unit and made a lot of good friends. We discussed to strongly abstain form all drug use especially LSD use as he has had psychotic episodes related to heavy LSD use. He agreed to start SAIOP with [facility]. On day of discharge, he was alert and oriented. HE had bright affect. He was thinking much clearer and no longer confused. HE was sleeping better. When asked, he denied SI or thoughts of  self harm. Denied AH, VH or HI. He is going to meet Lorella NimrodHarvey With [facility] this week. HE is ready to get back to work. He was given 7 day supply of medications.   The patient is at low risk of imminent suicide. Patient denied thoughts, intent, or plan for harm to self or others, expressed significant future orientation, and expressed an ability to mobilize assistance for his needs. He is presently void of any contributing psychiatric symptoms, cognitive difficulties, or substance use which would elevate his risk for lethality. Chronic risk for lethality is elevated in light of drug use, male gender. The chronic risk is presently mitigated by his ongoing desire and engagement [facility] and mobilization of support from family and friends. Chronic risk may elevate if he experiences any significant loss or worsening of symptoms, which can be managed and monitored through outpatient providers. At this time,a cute risk for lethality is low and he is stable for ongoing outpatient management.   Modifiable risk factors were addressed during this hospitalization through appropriate pharmacotherapy and establishment of outpatient follow-up treatment. Some risk factors for suicide are situational (i.e. Unstable housing) or related personality pathology (i.e. Poor coping mechanisms) and thus cannot be further mitigated by continued hospitalization in this setting.    Physical Findings: AIMS: Facial and Oral Movements Muscles of Facial Expression: None, normal Lips and Perioral Area: None, normal Jaw: None, normal Tongue: None, normal,Extremity Movements Upper (arms, wrists, hands, fingers): None, normal Lower (legs, knees, ankles, toes): None, normal, Trunk Movements Neck, shoulders, hips: None, normal, Overall Severity Severity of abnormal movements (highest score from questions above): None, normal Incapacitation due to abnormal movements: None, normal Patient's awareness of abnormal movements (rate only  patient's report): No Awareness, Dental Status Current problems with teeth and/or dentures?: No Does patient usually wear dentures?: No  CIWA:  CIWA-Ar Total: 1 COWS:     Musculoskeletal: Strength & Muscle Tone: within normal limits Gait & Station: normal Patient leans: N/A  Psychiatric Specialty Exam: Physical Exam  Nursing note and vitals reviewed.   Review of Systems  All other systems reviewed and are negative.   Blood pressure (!) 141/83, pulse 93, temperature 98.2 F (36.8 C), temperature source Oral, resp. rate 18, height 5\' 10"  (1.778 m), weight 75.3 kg (166 lb), SpO2 99 %.Body mass index is 23.82 kg/m.  General Appearance: Casual  Eye Contact:  Good  Speech:  Clear and Coherent  Volume:  Normal  Mood:  Euthymic  Affect:  Appropriate  Thought Process:  Coherent and Goal Directed  Orientation:  Full (Time, Place, and Person)  Thought Content:  Logical  Suicidal Thoughts:  No  Homicidal Thoughts:  No  Memory:  Immediate;   Fair  Judgement:  Fair  Insight:  Fair  Psychomotor  Activity:  Normal  Concentration:  Concentration: Fair  Recall:  Fair  Fund of Knowledge:  Fair  Language:  Fair  Akathisia:  No      Assets:  Communication Skills Desire for Improvement Housing Resilience  ADL's:  Intact  Cognition:  WNL  Sleep:  Number of Hours: 7     Have you used any form of tobacco in the last 30 days? (Cigarettes, Smokeless Tobacco, Cigars, and/or Pipes): No  Has this patient used any form of tobacco in the last 30 days? (Cigarettes, Smokeless Tobacco, Cigars, and/or Pipes) No  Blood Alcohol level:  Lab Results  Component Value Date   ETH <10 02/01/2018   ETH <10 (H) 07/28/2017    Metabolic Disorder Labs:  Lab Results  Component Value Date   HGBA1C 5.0 02/02/2018   MPG 96.8 02/02/2018   MPG 93.93 07/30/2017   No results found for: PROLACTIN Lab Results  Component Value Date   CHOL 170 02/02/2018   TRIG 108 02/02/2018   HDL 36 (L) 02/02/2018    CHOLHDL 4.7 02/02/2018   VLDL 22 02/02/2018   LDLCALC 112 (H) 02/02/2018   LDLCALC 84 07/30/2017    See Psychiatric Specialty Exam and Suicide Risk Assessment completed by Attending Physician prior to discharge.  Discharge destination:  Home  Is patient on multiple antipsychotic therapies at discharge:  No   Has Patient had three or more failed trials of antipsychotic monotherapy by history:  No  Recommended Plan for Multiple Antipsychotic Therapies: NA  Discharge Instructions    Increase activity slowly   Complete by:  As directed      Allergies as of 02/09/2018   No Known Allergies     Medication List    STOP taking these medications   lithium carbonate 300 MG CR tablet Commonly known as:  LITHOBID Replaced by:  lithium 600 MG capsule   OLANZapine 15 MG tablet Commonly known as:  ZYPREXA   traZODone 100 MG tablet Commonly known as:  DESYREL     TAKE these medications     Indication  lithium 600 MG capsule Take 1 capsule (600 mg total) by mouth 2 (two) times daily with a meal. Replaces:  lithium carbonate 300 MG CR tablet  Indication:  Schizoaffective Disorder   risperiDONE 1 MG tablet Commonly known as:  RISPERDAL Take 1 tablet (1 mg total) by mouth 2 (two) times daily.  Indication:  Psychosis      Follow-up Information    [facility] Go on 02/10/2018.   Why:  Please attend your [facility] up appointment at [facility] on Wednesday 02/10/2018 at 7:15amYou will be meeting with Unk Pinto at 7:15am for peer support services. Unk [phone]. Thank you. Contact information: [address] [place] [address] [phone]           Follow-up recommendations:  Follow up with [facility]  Signed: Haskell Riling, MD 02/09/2018, 9:08 AM

## 2018-02-09 NOTE — Progress Notes (Signed)
  [facility] :  Will you be returning to the same living situation after discharge:  Yes,  Back with family At discharge, do you have transportation home?: Yes,  Drove self Do you have the ability to pay for your medications: Yes,  Referred to a provider who can assist  Release of information consent forms completed and in the chart;  Patient's signature needed at discharge.  Patient to Follow up at: Follow-up Information    [facility] Go on 02/12/2018.   Why:  [facility] Friday 02/12/2018 at 7:15amYou will be meeting with Unk PintoHarvey Bryant at 7:15am for peer support services. Unk PintoHarvey [phone]. Thank you. Contact information: [address] [place] [id number] [phone]           Next level of care provider has access to [facility] Link:no  Safety Planning and Suicide Prevention discussed: Yes,  Casimer LaniusRae Berberian, Mother, [phone]  Have you used any form of tobacco in the last 30 days? (Cigarettes, Smokeless Tobacco, Cigars, and/or Pipes): No  Has patient been referred to the Quitline?: N/A patient is not a smoker  Patient has been referred for addiction treatment: Yes  Johny ShearsCassandra  Rushton Early, LCSW 02/09/2018, 9:09 AM

## 2018-02-09 NOTE — Progress Notes (Signed)
D: Pt denies SI/HI/AVH, affect is flat and mood is pleasant and cooperative. Pt  appears less anxious and he is interacting with peers and staff appropriately.  A: Pt was offered support and encouraged to attend evening group.. Q 15 minute checks were done for safety.  R:Pt attends evening group and interacts appropriately with peers and staff. No distress noted safety maintained on unit.

## 2018-02-09 NOTE — Progress Notes (Signed)
Recreation Therapy Notes   Date: 02/09/2018  Time: 9:30 am  Location: Craft Room  Behavioral response: Appropriate  Intervention Topic: Goals  Discussion/Intervention:  Group content on today was focused on goals. Patients described what goals are and how they define goals. Individuals expressed how they go about setting goals and reaching them. The group identified how important goals are and if they make short term goals to reach long term goals. Patients described how many goals they work on at a time and what affects them not reaching their goal. Individuals described how much time they put into planning and obtaining their goals. The group participated in the intervention "My Goal Board" and made personal goal boards to help them achieve their goal Clinical Observations/Feedback:  Patient came to group and stated goals are objectives and he takes steps to reach his goals. Individual described that he makes smart goals so they will be easy to reach. He participated in the intervention and was social with peers and staff during group. Emanie Behan LRT/CTRS         Sante Biedermann 02/09/2018 10:26 AM

## 2018-02-09 NOTE — Progress Notes (Signed)
Recreation Therapy Notes  INPATIENT RECREATION TR PLAN  Patient Details Name: Darryl Hughes MRN: [id number] DOB: 12-Sep-1996 Today's Date: 02/09/2018  Rec Therapy Plan Is patient appropriate for Therapeutic Recreation?: Yes Treatment times per week: at least 3 Estimated Length of Stay: 5-7 days TR Treatment/Interventions: Group participation (Comment)  Discharge Criteria Pt will be discharged from therapy if:: Discharged Treatment plan/goals/alternatives discussed and agreed upon by:: Patient/family  Discharge Summary Short term goals set: Patient will identify 3 triggers for depression within 5 recreation therapy group sessions Short term goals met: Adequate for discharge Progress toward goals comments: Groups attended Which groups?: Leisure education, Goal setting(Creative expressions, team work) Reason goals not met: N/A Therapeutic equipment acquired: N/A Reason patient discharged from therapy: Discharge from hospital Pt/family agrees with progress & goals achieved: Yes Date patient discharged from therapy: 02/09/18   Myrle Wanek 02/09/2018, 11:08 AM

## 2018-02-09 NOTE — BHH Group Notes (Signed)
  02/09/2018  Time: 0900  Type of Therapy and Topic: Group Therapy: Goals Group: SMART Goals   Participation Level:  Active   Description of Group:   The purpose of a daily goals group is to assist and guide patients in setting recovery/wellness-related goals. The objective is to set goals as they relate to the crisis in which they were admitted. Patients will be using SMART goal modalities to set measurable goals. Characteristics of realistic goals will be discussed and patients will be assisted in setting and processing how one will reach their goal. Facilitator will also assist patients in applying interventions and coping skills learned in psycho-education groups to the SMART goal and process how one will achieve defined goal.   Therapeutic Goals:  -Patients will develop and document one goal related to or their crisis in which brought them into treatment.  -Patients will be guided by LCSW using SMART goal setting modality in how to set a measurable, attainable, realistic and time sensitive goal.  -Patients will process barriers in reaching goal.  -Patients will process interventions in how to overcome and successful in reaching goal.   Patient's Goal:  Pt continues to work towards their tx goals but has not yet reached them. Pt was able to appropriately participate in group discussion, and was able to offer support/validation to other group members. Pt reported his goal for the today is to, "see at least three family members or friends, after I discharge, by the end of the day."   Therapeutic Modalities:  Motivational Interviewing  Cognitive Behavioral Therapy  Crisis Intervention Model  SMART goals setting   Heidi DachKelsey Lorenia Hoston, MSW, LCSW Clinical Social Worker 02/09/2018 9:41 AM

## 2018-02-09 NOTE — [facility] Suicide Risk Assessment (Signed)
[facility]   Principal Problem: Schizoaffective disorder [facility](HCC) Discharge Diagnoses:  Patient Active Problem List   Diagnosis Date Noted  . Schizoaffective disorder (HCC) [F25.9] 02/02/2018    Priority: High  . Cocaine abuse (HCC) [F14.10] 02/01/2018  . 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 Cannabis use disorder, mild, abuse [F12.10] 07/28/2017    Mental Status Per Nursing Assessment::   On Admission:  NA  Demographic Factors:  Male and Caucasian  Loss Factors: NA  Historical Factors: NA  Risk Reduction Factors:   Employed, Living with another person, especially a relative, Positive social support, Positive therapeutic relationship and Positive coping skills or problem solving skills  Continued Clinical Symptoms:  anxiety  Cognitive Features That Contribute To Risk:  None    Suicide Risk:  Minimal: No identifiable suicidal ideation.   Plan Of Care/Follow-up recommendations: Follow up with [facility] On wednesday  Haskell RilingHolly R Korrie Hofbauer, MD 02/09/2018, 9:07 AM

## 2018-02-09 NOTE — Plan of Care (Signed)
  Problem: Activity: Goal: Will verbalize the importance of balancing activity with adequate rest periods Outcome: Progressing   Problem: Education: Goal: Will be free of psychotic symptoms Outcome: Progressing   Problem: Education: Goal: Knowledge of the prescribed therapeutic regimen will improve Outcome: Progressing   Problem: Coping: Goal: Coping ability will improve Outcome: Progressing   Problem: Health Behavior/Discharge Planning: Goal: Compliance with prescribed medication regimen will improve Outcome: Progressing

## 2018-02-13 ENCOUNTER — Emergency Department: Payer: Self-pay

## 2018-02-13 ENCOUNTER — Encounter: Payer: Self-pay | Admitting: Emergency Medicine

## 2018-02-13 ENCOUNTER — Emergency Department
Admission: EM | Admit: 2018-02-13 | Discharge: 2018-02-14 | Disposition: A | Discharge status: Home / Self Care | Payer: Self-pay | Attending: Emergency Medicine | Admitting: Emergency Medicine

## 2018-02-13 DIAGNOSIS — E119 Type 2 diabetes mellitus without complications: Secondary | ICD-10-CM | POA: Insufficient documentation

## 2018-02-13 DIAGNOSIS — F319 Bipolar disorder, unspecified: Secondary | ICD-10-CM | POA: Insufficient documentation

## 2018-02-13 DIAGNOSIS — I10 Essential (primary) hypertension: Secondary | ICD-10-CM | POA: Insufficient documentation

## 2018-02-13 DIAGNOSIS — R4182 Altered mental status, unspecified: Secondary | ICD-10-CM | POA: Insufficient documentation

## 2018-02-13 LAB — URINALYSIS, COMPLETE (UACMP) WITH MICROSCOPIC
Bacteria, UA: NONE SEEN
Bilirubin Urine: NEGATIVE
GLUCOSE, UA: NEGATIVE mg/dL
HGB URINE DIPSTICK: NEGATIVE
Ketones, ur: NEGATIVE mg/dL
LEUKOCYTES UA: NEGATIVE
Nitrite: NEGATIVE
PH: 8 (ref 5.0–8.0)
PROTEIN: NEGATIVE mg/dL
Specific Gravity, Urine: 1.002 — ABNORMAL LOW (ref 1.005–1.030)
Squamous Epithelial / LPF: NONE SEEN

## 2018-02-13 LAB — CBC WITH DIFFERENTIAL/PLATELET
Basophils Absolute: 0 10*3/uL (ref 0–0.1)
Basophils Relative: 0 %
EOS ABS: 0 10*3/uL (ref 0–0.7)
EOS PCT: 0 %
HCT: 46 % (ref 40.0–52.0)
Hemoglobin: 15.8 g/dL (ref 13.0–18.0)
Lymphocytes Relative: 10 %
Lymphs Abs: 1.3 10*3/uL (ref 1.0–3.6)
MCH: 30.2 pg (ref 26.0–34.0)
MCHC: 34.4 g/dL (ref 32.0–36.0)
MCV: 87.8 fL (ref 80.0–100.0)
Monocytes Absolute: 0.6 10*3/uL (ref 0.2–1.0)
Monocytes Relative: 5 %
Neutro Abs: 11.3 10*3/uL — ABNORMAL HIGH (ref 1.4–6.5)
Neutrophils Relative %: 85 %
PLATELETS: 263 10*3/uL (ref 150–440)
RBC: 5.24 MIL/uL (ref 4.40–5.90)
RDW: 13 % (ref 11.5–14.5)
WBC: 13.3 10*3/uL — AB (ref 3.8–10.6)

## 2018-02-13 LAB — COMPREHENSIVE METABOLIC PANEL
ALBUMIN: 4.6 g/dL (ref 3.5–5.0)
ALT: 77 U/L — ABNORMAL HIGH (ref 17–63)
ANION GAP: 7 (ref 5–15)
AST: 47 U/L — ABNORMAL HIGH (ref 15–41)
Alkaline Phosphatase: 60 U/L (ref 38–126)
BILIRUBIN TOTAL: 1 mg/dL (ref 0.3–1.2)
BUN: 10 mg/dL (ref 6–20)
CO2: 27 mmol/L (ref 22–32)
Calcium: 9.8 mg/dL (ref 8.9–10.3)
Chloride: 102 mmol/L (ref 101–111)
Creatinine, Ser: 0.84 mg/dL (ref 0.61–1.24)
Glucose, Bld: 105 mg/dL — ABNORMAL HIGH (ref 65–99)
POTASSIUM: 3.7 mmol/L (ref 3.5–5.1)
Sodium: 136 mmol/L (ref 135–145)
TOTAL PROTEIN: 7.6 g/dL (ref 6.5–8.1)

## 2018-02-13 LAB — CK: CK TOTAL: 229 U/L (ref 49–397)

## 2018-02-13 LAB — URINE DRUG SCREEN, QUALITATIVE (ARMC ONLY)
AMPHETAMINES, UR SCREEN: NOT DETECTED
Barbiturates, Ur Screen: NOT DETECTED
Benzodiazepine, Ur Scrn: NOT DETECTED
Cannabinoid 50 Ng, Ur ~~LOC~~: POSITIVE — AB
Cocaine Metabolite,Ur ~~LOC~~: NOT DETECTED
MDMA (ECSTASY) UR SCREEN: NOT DETECTED
Methadone Scn, Ur: NOT DETECTED
OPIATE, UR SCREEN: NOT DETECTED
PHENCYCLIDINE (PCP) UR S: NOT DETECTED
Tricyclic, Ur Screen: NOT DETECTED

## 2018-02-13 LAB — ACETAMINOPHEN LEVEL: Acetaminophen (Tylenol), Serum: <10 ug/mL — ABNORMAL LOW (ref 10–30)

## 2018-02-13 LAB — LACTIC ACID, PLASMA: LACTIC ACID, VENOUS: 2.4 mmol/L — AB (ref 0.5–1.9)

## 2018-02-13 LAB — SALICYLATE LEVEL: Salicylate Lvl: <7 mg/dL (ref 2.8–30.0)

## 2018-02-13 LAB — ETHANOL: Alcohol, Ethyl (B): <10 mg/dL (ref <10)

## 2018-02-13 MED ORDER — SODIUM CHLORIDE 0.9 % IV BOLUS
1000.0000 mL | Freq: Once | INTRAVENOUS | Status: AC
  Administered 2018-02-13: 1000 mL via INTRAVENOUS

## 2018-02-13 NOTE — ED Triage Notes (Signed)
Pt arrives to hospital, naked. Pt covered with blanket in men's restroom by officer and this rn. Pt assisted to wheelchair and back to room 26. Pt is able to state his name and birthday after multiple prompts by rn, but does not answer other questions.

## 2018-02-13 NOTE — ED Provider Notes (Signed)
[facility] Emergency Department Provider Note   ____________________________________________   None    (approximate)  I have reviewed the triage vital signs and the nursing notes.   HISTORY  Chief Complaint Altered Mental Status (Pt. states friend brought him to ED.)   HPI Darryl Hughes is a 22 y.o. male Who comes into the emergency room naked. Patient says he has multiple medical problems including hypertension diabetes and asthma and COPD.treview of his old records does not support this. Patient was recently in the emergency room with bipolar disorder and psychotic features after dropping acid and using marijuana apparently.   Past Medical History:  Diagnosis Date  . Bipolar disorder (HCC)   . 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 Depression     Patient Active Problem List   Diagnosis Date Noted  . Schizoaffective disorder (HCC) 02/02/2018  . Cocaine abuse (HCC) 02/01/2018  . 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 Cannabis use disorder, mild, abuse 07/28/2017    Past Surgical History:  Procedure Laterality Date  . EYE SURGERY      Prior to Admission medications   Medication Sig Start Date End Date Taking? Authorizing Provider  lithium 600 MG capsule Take 1 capsule (600 mg total) by mouth 2 (two) times daily with a meal. 02/08/18   McNew, Ileene Hutchinson, MD  risperiDONE (RISPERDAL) 1 MG tablet Take 1 tablet (1 mg total) by mouth 2 (two) times daily. 02/08/18   McNew, Ileene Hutchinson, MD    Allergies Patient has no known allergies.  History reviewed. No pertinent family history.  Social History Social History   Tobacco Use  . Smoking status: Never Smoker  . Smokeless tobacco: Never Used  Substance Use Topics  . Alcohol use: Yes    Comment: drinks one beer less than monthly  . Drug use: Yes    Types: Marijuana, LSD, Cocaine    Comment: marijuana daily, cocaine and LSD occasional    Review of  Systems  Constitutional: No fever/chills Eyes: No visual changes. ENT: No sore throat. Cardiovascular: Denies chest pain. Respiratory: Denies shortness of breath. Gastrointestinal: No abdominal pain.  No nausea, no vomiting.  No diarrhea.  No constipation. Genitourinary: Negative for dysuria. Musculoskeletal: Negative for back pain. Skin: Negative for rash. Neurological: Negative for headaches, focal weakness___________________   PHYSICAL EXAM:  VITAL SIGNS: ED Triage Vitals  Enc Vitals Group     BP      Pulse      Resp      Temp      Temp src      SpO2      Weight      Height      Head Circumference      Peak Flow      Pain Score      Pain Loc      Pain Edu?      Excl. in GC?     Constitutional: Alert Well appearing and in no acute distress. Eyes: Conjunctivae are normal. PER. EOMI. Head: Atraumatic. Nose: No congestion/rhinnorhea. Mouth/Throat: Mucous membranes are moist.  Oropharynx non-erythematous. Neck: No stridor. Cardiovascular: Normal rate, regular rhythm. Grossly normal heart sounds.  Good peripheral circulation. Respiratory: Normal respiratory effort.  No retractions. Lungs CTAB. Gastrointestinal: Soft and nontender. No distention. No abdominal bruits. No CVA tenderness. Musculoskeletal: No lower extremity tenderness nor edema.  No joint effusions. Neurologic:  Normal speech and language. No gross focal neurologic  deficits are appreciated. No gait instability.patient will not follow commands well enough to do a good neurological exam but he is moving all extremities equally and well Skin:  Skin is warm, dry and intact. No rash noted. Psychiatric: she appears to be somewhat confused and occasionally stares off into space as if possibly responding to internal stimuli  ____________________________________________   LABS (all labs ordered are listed, but only abnormal results are displayed)  Labs Reviewed  ACETAMINOPHEN LEVEL - Abnormal; Notable for the  following components:      Result Value   Acetaminophen (Tylenol), Serum <10 (*)    All other components within normal limits  COMPREHENSIVE METABOLIC PANEL - Abnormal; Notable for the following components:   Glucose, Bld 105 (*)    AST 47 (*)    ALT 77 (*)    All other components within normal limits  LACTIC ACID, PLASMA - Abnormal; Notable for the following components:   Lactic Acid, Venous 2.4 (*)    All other components within normal limits  CBC WITH DIFFERENTIAL/PLATELET - Abnormal; Notable for the following components:   WBC 13.3 (*)    Neutro Abs 11.3 (*)    All other components within normal limits  URINALYSIS, COMPLETE (UACMP) WITH MICROSCOPIC - Abnormal; Notable for the following components:   Color, Urine COLORLESS (*)    APPearance CLEAR (*)    Specific Gravity, Urine 1.002 (*)    All other components within normal limits  URINE DRUG SCREEN, QUALITATIVE ([facility] ONLY) - Abnormal; Notable for the following components:   Cannabinoid 50 Ng, Ur [place] POSITIVE (*)    All other components within normal limits  ETHANOL  SALICYLATE LEVEL  CK  LACTIC ACID, PLASMA   ____________________________________________  EKG  EKG read and interpreted by me shows normal sinus rhythm rate of 97 normal axis no acute ST-T wave changes____________________________________________  RADIOLOGY  ED MD interpretation:   Official radiology report(s): Dg Chest 2 View  Result Date: 02/13/2018 CLINICAL DATA:  Altered mental status. EXAM: CHEST - 2 VIEW COMPARISON:  None. FINDINGS: The cardiomediastinal contours are normal. The lungs are clear. Pulmonary vasculature is normal. No consolidation, pleural effusion, or pneumothorax. No acute osseous abnormalities are seen. IMPRESSION: Normal radiographs of the chest. Electronically Signed   By: Rubye Oaks M.D.   On: 02/13/2018 23:03   Ct Head Wo Contrast  Result Date: 02/13/2018 CLINICAL DATA:  Altered mental status. EXAM: CT HEAD WITHOUT CONTRAST  TECHNIQUE: Contiguous axial images were obtained from the base of the skull through the vertex without intravenous contrast. COMPARISON:  Head CT 02/05/2018 FINDINGS: Brain: Brain volume is normal for age. No intracranial hemorrhage, mass effect, or midline shift. No hydrocephalus. The basilar cisterns are patent. No evidence of territorial infarct or acute ischemia. No extra-axial or intracranial fluid collection. Vascular: No hyperdense vessel or unexpected calcification. Skull: No fracture or focal lesion. Sinuses/Orbits: Paranasal sinuses and mastoid air cells are clear. The visualized orbits are unremarkable. Other: None. IMPRESSION: Negative noncontrast head CT. Electronically Signed   By: Rubye Oaks M.D.   On: 02/13/2018 22:28    ____________________________________________   PROCEDURES  Procedure(s) performed:   Procedures  Critical Care performed:   ____________________________________________   INITIAL IMPRESSION / ASSESSMENT AND PLAN / ED COURSE  ----------------------------------------- 10:51 PM on 02/13/2018 -----------------------------------------  At present time patient is awake alert oriented 3 and acting normally. Not sure exactly what happened he will continue to evaluate him.  ----------------------------------------- 11:46 PM on 02/13/2018 -----------------------------------------  Patient continues  to act much better. Anticipate this is sequelae of drug use. Maintain him under commitment for the time being. He was acting psychotic previously.       ____________________________________________   FINAL CLINICAL IMPRESSION(S) / ED DIAGNOSES  Final diagnoses:  Altered mental status, unspecified altered mental status type     ED Discharge Orders    None       Note:  This document was prepared using Dragon voice recognition software and may include unintentional dictation errors.    Arnaldo NatalMalinda, Jader Desai F, MD 02/13/18 [phone]

## 2018-02-14 NOTE — ED Provider Notes (Signed)
The patient has been evaluated at bedside by telepsych.  Patient is clinically stable.  Not felt to be a danger to self or others.  No SI or Hi.  No indication for inpatient psychiatric admission at this time.  Appropriate for continued outpatient therapy.    Willy Eddyobinson, Neftaly Swiss, MD 02/14/18 [phone]

## 2018-02-14 NOTE — ED Notes (Signed)
Patient is currently speaking with S.O.C.---Dr. Quillian Quinceullen.

## 2018-02-14 NOTE — BH Assessment (Signed)
Assessment Note  Darryl Hughes is an 22 y.o. male. Presents to ED via [facility] confused, naked, and disoriented. Pt has hx of drug use and substance induced mood symptoms. Pt recently discharged from [facility]. At time of assessment, pt logical and coherent. Pt presents to having little recollection of events leading to ED. Pt admits to marijuana and acid use earlier in the day. Pt stated, "I feel much better now than I did when I came in. I think the IV helped, I may have been dehydrated." Pt attributes substance use to being stressed. Pt stated, "I lost my job recently and honestly I'm having trouble finding my faith." Pt reports no SI, HI or AH, VH  Diagnosis: Other hallucinogen use disorder  Past Medical History:  Past Medical History:  Diagnosis Date  . Bipolar disorder (HCC)   . 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 Depression     Past Surgical History:  Procedure Laterality Date  . EYE SURGERY      Family History: History reviewed. No pertinent family history.  Social History:  reports that he has never smoked. He has never used smokeless tobacco. He reports that he drinks alcohol. He reports that he has current or past drug history. Drugs: Marijuana, LSD, and Cocaine.  Additional Social History:  Alcohol / Drug Use Pain Medications: see PTA Prescriptions: see PTA  Over the Counter: see PTA History of alcohol / drug use?: Yes Longest period of sobriety (when/how long): unknown Negative Consequences of Use: Financial, Personal relationships, Work / School Withdrawal Symptoms: Delirium Substance #1 Name of Substance 1: Acid 1 - Age of First Use: 19 1 - Amount (size/oz): unknown 1 - Frequency: varies 1 - Duration: varies 1 - Last Use / Amount: yesterday Substance #2 Name of Substance 2: marijuana 2 - Age of First Use: 19 2 - Amount (size/oz): 2 blunts per day 2 - Frequency: daily 2 - Duration: varies 2 - Last Use /  Amount: yesterday  CIWA: CIWA-Ar BP: 125/83 Pulse Rate: 89 COWS:    Allergies: No Known Allergies  Home Medications:  (Not in a hospital admission)  OB/GYN Status:  No LMP for male patient.  General Assessment Data Location of Assessment: [facility] ED TTS Assessment: In system Is this a Tele or Face-to-Face Assessment?: Face-to-Face Is this an Initial Assessment or a Re-assessment for this encounter?: Initial Assessment Marital status: Single Maiden name: N/A Is patient pregnant?: No Pregnancy Status: No Living Arrangements: Other relatives(Pt reports to living with sister) Can pt return to current living arrangement?: Yes Admission Status: Voluntary Is patient capable of signing voluntary admission?: (Upon admission pt unable to sign, pt presently capable) Referral Source: Self/Family/Friend Insurance type: None  Medical Screening Exam [facility] Walk-in ONLY) Medical Exam completed: Yes  Crisis Care Plan Living Arrangements: Other relatives(Pt reports to living with sister) Legal Guardian: (N/A) Name of Psychiatrist: [facility] Name of Therapist: [facility]  Education Status Is patient currently in school?: No Is the patient employed, unemployed or receiving disability?: Unemployed(Pt reports he recently lost his job)  Risk to self with the past 6 months Suicidal Ideation: No-Not Currently/Within Last 6 Months Has patient been a risk to self within the past 6 months prior to admission? : Other (comment)(Pt reports no plans or attempts/occasional thoughts of death) Suicidal Intent: No-Not Currently/Within Last 6 Months Has patient had any suicidal intent within the past 6 months prior to admission? : Other (comment)(Pt reports no  suicidal intent, pastrecords reflect possible ) Is patient at risk for suicide?: No, but patient needs Medical Clearance Suicidal Plan?: No Has patient had any suicidal plan within the past 6 months prior to admission? : No Access to Means: No What has been your  use of drugs/alcohol within the last 12 months?: Marijuana and acid use Previous Attempts/Gestures: No(Pt reports no previous attempts) How many times?: 0 Other Self Harm Risks: Drug use= pt shows confusion and difficulty making decisions Triggers for Past Attempts: None known Intentional Self Injurious Behavior: None(Pt reports none) Family Suicide History: No(Pt reports mom has depressive hx and SI hx) Recent stressful life event(s): Job Loss, Turmoil (Comment)(Pt reports to losing job and having issues with his faith) Persecutory voices/beliefs?: No Depression: Yes(Pt exhibited no present signs, pt self reported signs) Depression Symptoms: Feeling worthless/self pity, Guilt, Isolating Substance abuse history and/or treatment for substance abuse?: Yes Suicide prevention information given to non-admitted patients: Not applicable  Risk to Others within the past 6 months Homicidal Ideation: No Does patient have any lifetime risk of violence toward others beyond the six months prior to admission? : No Thoughts of Harm to Others: No Current Homicidal Intent: No Current Homicidal Plan: No Access to Homicidal Means: No Identified Victim: N/A History of harm to others?: No Assessment of Violence: None Noted Violent Behavior Description: N/A Does patient have access to weapons?: No Criminal Charges Pending?: No Does patient have a court date: No Is patient on probation?: Unknown  Psychosis Hallucinations: None noted Delusions: Unspecified(Pt arrived to ED naked and confused. )  Mental Status Report Appearance/Hygiene: In scrubs Eye Contact: Good Motor Activity: Unremarkable Speech: Unremarkable, Logical/coherent Level of Consciousness: Alert Mood: Pleasant Affect: Appropriate to circumstance Anxiety Level: Minimal Thought Processes: Relevant Judgement: Partial(Pt judgement impoved soon after arrival) Orientation: Appropriate for developmental age Obsessive Compulsive  Thoughts/Behaviors: None  Cognitive Functioning Concentration: Fair Memory: Recent Impaired(Pt unable to provide specific details on arrival prior to ED) Is patient IDD: No Is patient DD?: Unknown Insight: Fair Impulse Control: Good Appetite: Good Have you had any weight changes? : Gain Amount of the weight change? (lbs): 10 lbs Sleep: No Change Total Hours of Sleep: 8 Vegetative Symptoms: None  ADLScreening [facility] Assessment Services) Patient's cognitive ability adequate to safely complete daily activities?: Yes Patient able to express need for assistance with ADLs?: Yes Independently performs ADLs?: Yes (appropriate for developmental age)  Prior Inpatient Therapy Prior Inpatient Therapy: Yes Prior Therapy Dates: 2018, 2019 Prior Therapy Facilty/Provider(s): [facility] Reason for Treatment: hallucinations, possible depression  Prior Outpatient Therapy Prior Outpatient Therapy: Yes Prior Therapy Dates: 2019-current Prior Therapy Facilty/Provider(s): [facility] Reason for Treatment: substance abuse Does patient have an ACCT team?: No Does patient have Intensive In-House Services?  : No Does patient have [facility] services? : No Does patient have P4CC services?: No  ADL Screening (condition at time of admission) Patient's cognitive ability adequate to safely complete daily activities?: Yes Is the patient deaf or have difficulty hearing?: No Does the patient have difficulty seeing, even when wearing glasses/contacts?: No Does the patient have difficulty concentrating, remembering, or making decisions?: Yes Patient able to express need for assistance with ADLs?: Yes Does the patient have difficulty dressing or bathing?: No Independently performs ADLs?: Yes (appropriate for developmental age) Does the patient have difficulty walking or climbing stairs?: No Weakness of Legs: None Weakness of Arms/Hands: None  Home Assistive Devices/Equipment Home Assistive Devices/Equipment:  None  Therapy Consults (therapy consults require a physician order) PT Evaluation Needed: No OT  Evalulation Needed: No SLP Evaluation Needed: No Abuse/Neglect Assessment (Assessment to be complete while patient is alone) Abuse/Neglect Assessment Can Be Completed: Yes Physical Abuse: Denies Verbal Abuse: Denies Sexual Abuse: Denies Exploitation of patient/patient's resources: Denies Self-Neglect: Denies Values / Beliefs Cultural Requests During Hospitalization: None Spiritual Requests During Hospitalization: None Consults Spiritual Care Consult Needed: No Social Work Consult Needed: No Merchant navy officerAdvance Directives (For Healthcare) Does Patient Have a Medical Advance Directive?: No Would patient like information on creating a medical advance directive?: No - Patient declined    Additional Information 1:1 In Past 12 Months?: No CIRT Risk: No Elopement Risk: No Does patient have medical clearance?: Yes     Disposition:  Disposition Initial Assessment Completed for this Encounter: Yes Disposition of Patient: Discharge Patient refused recommended treatment: No Mode of transportation if patient is discharged?: Other([facility]) Patient referred to: Other (Comment)(Referred back to [facility] for continuation of services)  On Site Evaluation by:   Reviewed with Physician:    Aubery LappingJerrica  Chayim Bialas, MS, [facility] 02/14/2018 5:54 AM

## 2018-02-14 NOTE — ED Notes (Signed)
SOC called back and said he was going to rescind IVC.

## 2018-02-14 NOTE — Discharge Instructions (Addendum)
Follow up with [facility].  Do not use marijuana or other illicit drugs as they may worsen underlying psychiatric illness.

## 2018-02-14 NOTE — ED Notes (Signed)
Friend(Irvin) of pt. Left phone # if ride needed [phone]

## 2018-02-14 NOTE — ED Notes (Signed)
Pt. Given phone to try to call friend again.

## 2018-02-14 NOTE — ED Notes (Signed)
Patient using bathroom in his room.

## 2018-02-14 NOTE — ED Notes (Signed)
Pt. Going home with friend 

## 2018-02-14 NOTE — ED Notes (Addendum)
Called patients friend Francesca Jewett(Irwin) pt. Talking to friend, friend stated he could pick up patient.

## 2018-11-17 IMAGING — CT CT HEAD W/O CM
3 series · 16 of 47 positions shown, 19 images · non-contrast
Comparison: Head CT 02/05/2018

CLINICAL DATA: Altered mental status.

EXAM:
CT HEAD WITHOUT CONTRAST
TECHNIQUE: Contiguous axial images were obtained from the base of the skull
through the vertex without intravenous contrast.

[Series 3: head wo · axial · 0.43mm/px · z∈[+305,+430]mm · 10 of 30 slices shown, 13 images]
[im 3/30  brain]
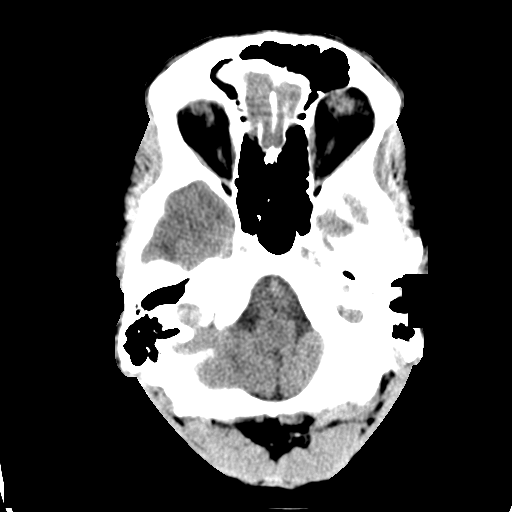
[im 3/30  bone]
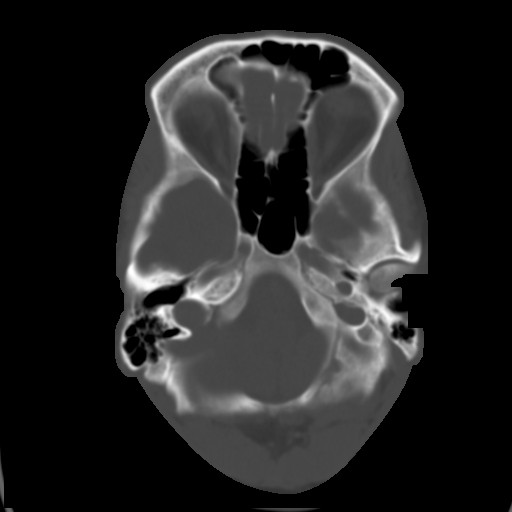
[im 6/30  brain]
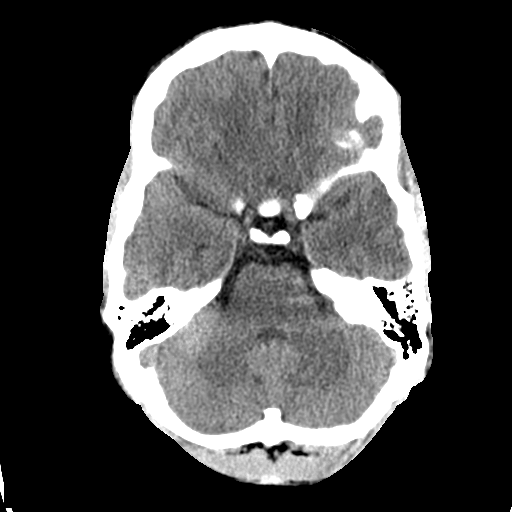
[im 9/30  brain]
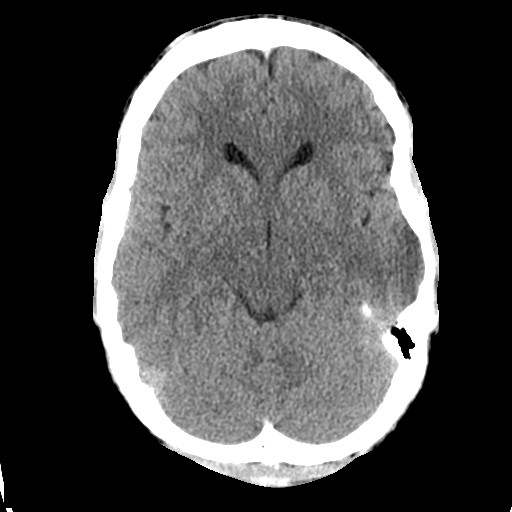
[im 11/30  brain]
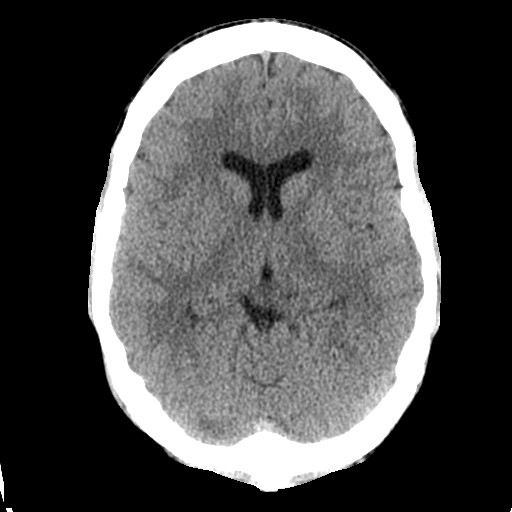
[im 14/30  brain]
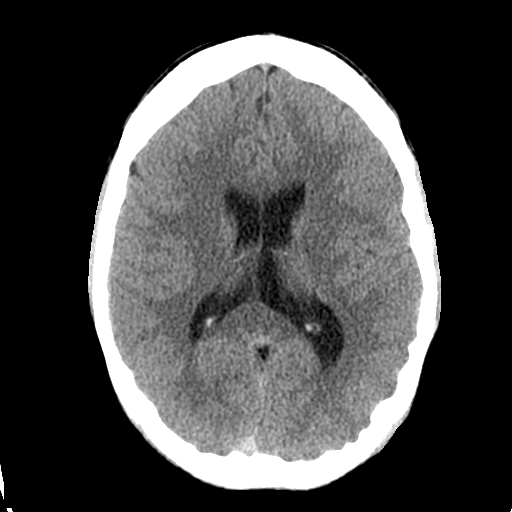
[im 14/30  bone]
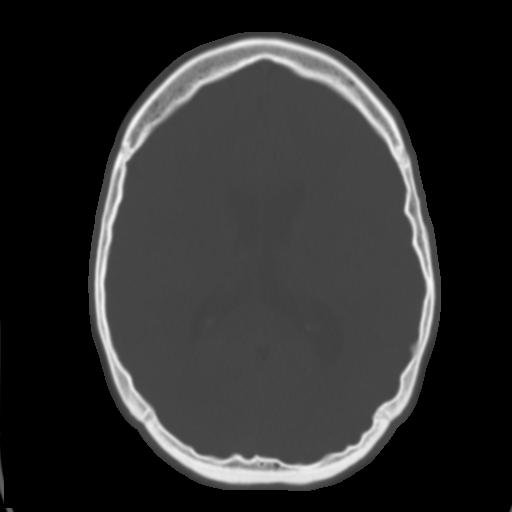
[im 17/30  brain]
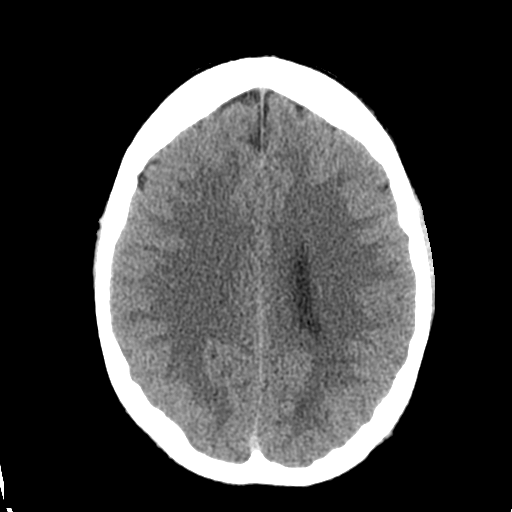
[im 20/30  brain]
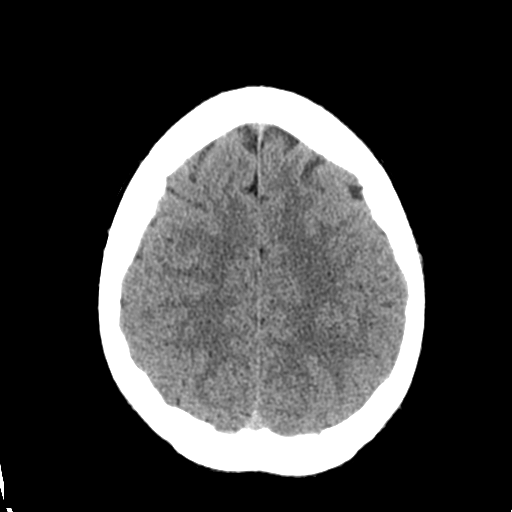
[im 23/30  brain]
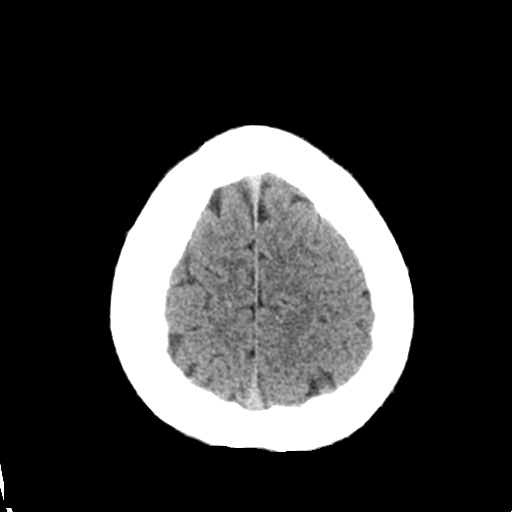
[im 25/30  brain]
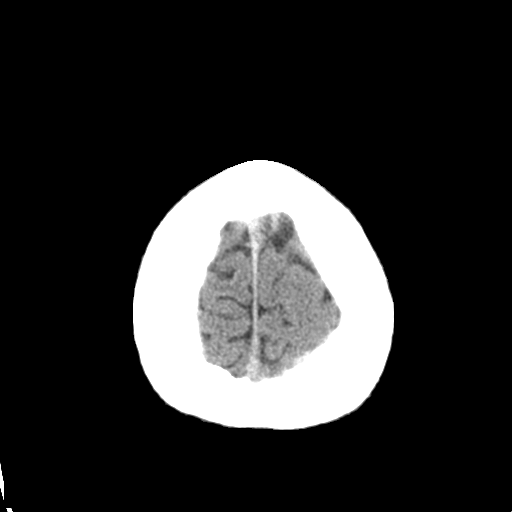
[im 25/30  bone]
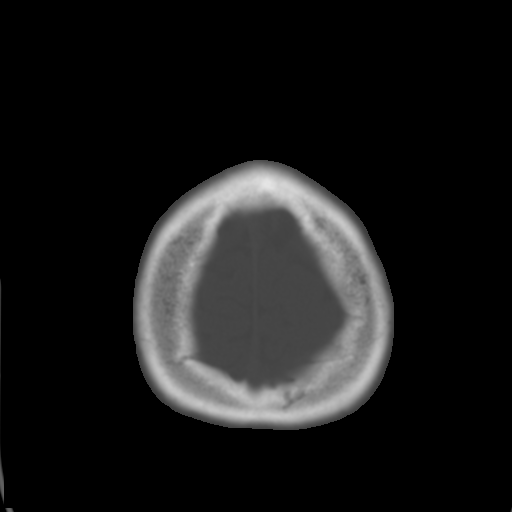
[im 28/30  brain]
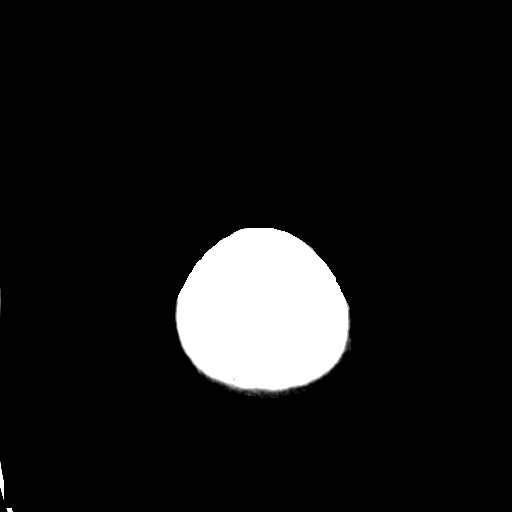

[Series 4: coronal soft tissue · coronal · 0.30mm/px · 3 of 68 slices shown]
[im 23/68  brain]
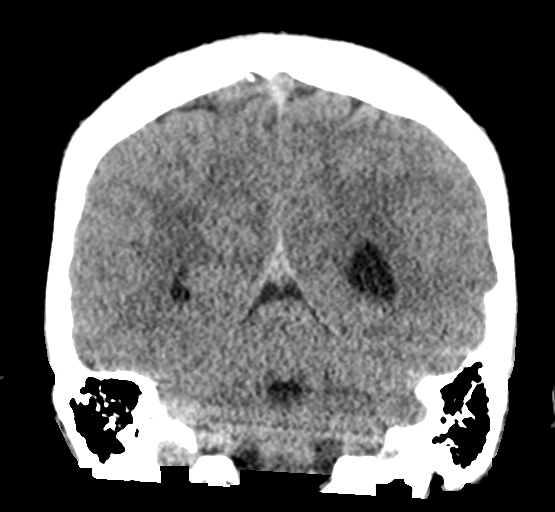
[im 30/68  brain]
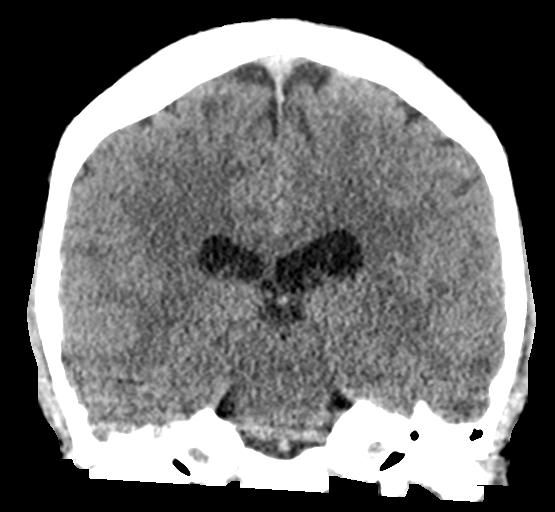
[im 38/68  brain]
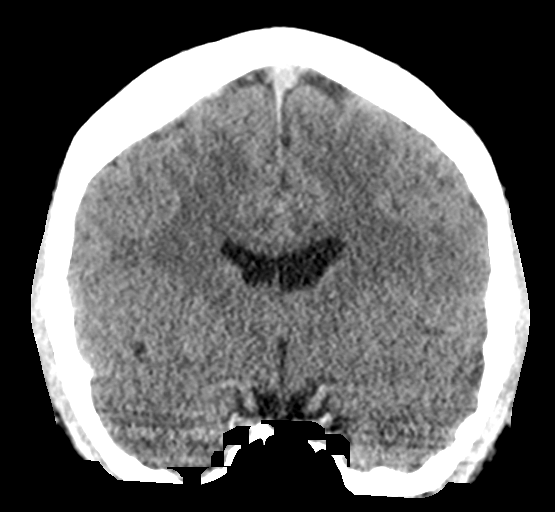

[Series 5: sagittal soft tissue · sagittal · 0.30mm/px · 3 of 56 slices shown]
[im 19/56  brain]
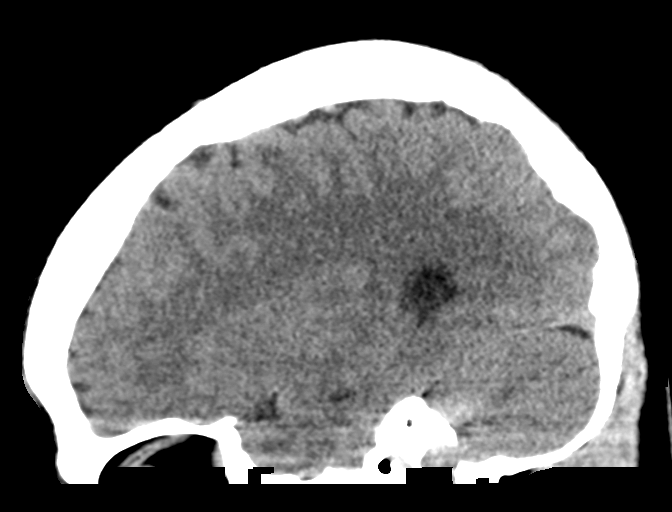
[im 28/56  brain]
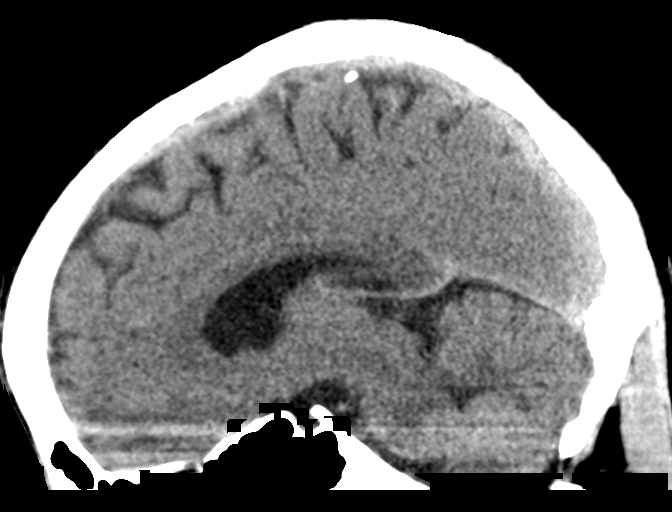
[im 37/56  brain]
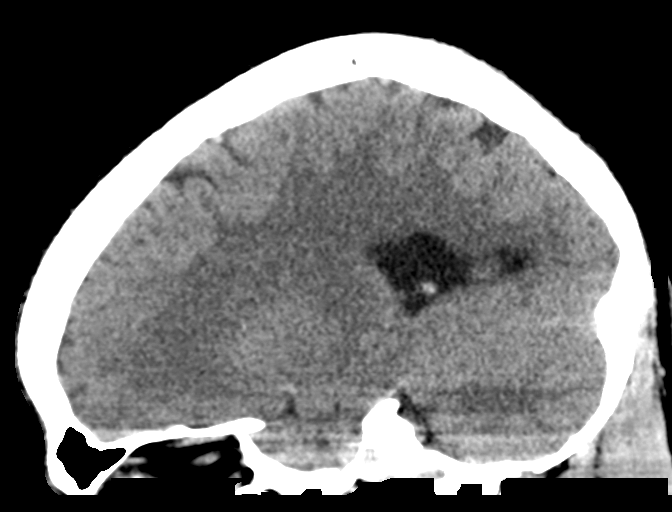

[16 of 47 positions shown; findings below may reference images not displayed]

FINDINGS: Brain: Brain volume is normal for age. No intracranial hemorrhage,
mass effect, or midline shift. No hydrocephalus. The basilar
cisterns are patent. No evidence of territorial infarct or acute
ischemia. No extra-axial or intracranial fluid collection.

Vascular: No hyperdense vessel or unexpected calcification.

Skull: No fracture or focal lesion.

Sinuses/Orbits: Paranasal sinuses and mastoid air cells are clear.
The visualized orbits are unremarkable.

Other: None.
IMPRESSION: Negative noncontrast head CT.
